# Patient Record
Sex: Female | Born: 1945 | Race: White | Hispanic: No | Marital: Married | Smoking: Former smoker
Health system: Southern US, Community
[De-identification: ages and names within clinical notes are randomized; demographics above are authoritative.]

## PROBLEM LIST (undated history)

## (undated) DIAGNOSIS — M1990 Unspecified osteoarthritis, unspecified site: Secondary | ICD-10-CM

## (undated) DIAGNOSIS — G4700 Insomnia, unspecified: Secondary | ICD-10-CM

## (undated) DIAGNOSIS — T7840XA Allergy, unspecified, initial encounter: Secondary | ICD-10-CM

## (undated) DIAGNOSIS — D72818 Other decreased white blood cell count: Secondary | ICD-10-CM

## (undated) DIAGNOSIS — G8929 Other chronic pain: Secondary | ICD-10-CM

## (undated) DIAGNOSIS — K219 Gastro-esophageal reflux disease without esophagitis: Secondary | ICD-10-CM

## (undated) DIAGNOSIS — M549 Dorsalgia, unspecified: Secondary | ICD-10-CM

## (undated) DIAGNOSIS — M62830 Muscle spasm of back: Secondary | ICD-10-CM

## (undated) DIAGNOSIS — F419 Anxiety disorder, unspecified: Secondary | ICD-10-CM

## (undated) DIAGNOSIS — K5790 Diverticulosis of intestine, part unspecified, without perforation or abscess without bleeding: Secondary | ICD-10-CM

## (undated) DIAGNOSIS — M810 Age-related osteoporosis without current pathological fracture: Secondary | ICD-10-CM

## (undated) DIAGNOSIS — K589 Irritable bowel syndrome without diarrhea: Secondary | ICD-10-CM

## (undated) DIAGNOSIS — I639 Cerebral infarction, unspecified: Secondary | ICD-10-CM

## (undated) DIAGNOSIS — D729 Disorder of white blood cells, unspecified: Secondary | ICD-10-CM

## (undated) HISTORY — DX: Allergy, unspecified, initial encounter: T78.40XA

## (undated) HISTORY — DX: Unspecified osteoarthritis, unspecified site: M19.90

## (undated) HISTORY — PX: OTHER SURGICAL HISTORY: SHX169

## (undated) HISTORY — PX: CHOLECYSTECTOMY: SHX55

## (undated) HISTORY — DX: Other decreased white blood cell count: D72.818

## (undated) HISTORY — PX: APPENDECTOMY: SHX54

## (undated) HISTORY — DX: Cerebral infarction, unspecified: I63.9

## (undated) HISTORY — DX: Disorder of white blood cells, unspecified: D72.9

---

## 2004-12-06 ENCOUNTER — Ambulatory Visit: Payer: Self-pay | Admitting: Internal Medicine

## 2005-01-10 ENCOUNTER — Ambulatory Visit: Payer: Self-pay | Admitting: Internal Medicine

## 2009-01-04 ENCOUNTER — Ambulatory Visit: Payer: Self-pay | Admitting: Cardiology

## 2009-01-05 ENCOUNTER — Inpatient Hospital Stay (HOSPITAL_COMMUNITY): Admission: EM | Admit: 2009-01-05 | Discharge: 2009-01-06 | Payer: Self-pay | Admitting: Emergency Medicine

## 2009-01-05 ENCOUNTER — Encounter (INDEPENDENT_AMBULATORY_CARE_PROVIDER_SITE_OTHER): Payer: Self-pay | Admitting: Internal Medicine

## 2009-02-04 ENCOUNTER — Emergency Department: Payer: Self-pay | Admitting: Emergency Medicine

## 2009-02-09 ENCOUNTER — Emergency Department: Payer: Self-pay | Admitting: Emergency Medicine

## 2009-12-21 ENCOUNTER — Ambulatory Visit: Payer: Self-pay | Admitting: Internal Medicine

## 2010-01-18 ENCOUNTER — Ambulatory Visit: Payer: Self-pay | Admitting: Gastroenterology

## 2010-02-15 ENCOUNTER — Ambulatory Visit: Payer: Self-pay | Admitting: Gastroenterology

## 2010-09-05 ENCOUNTER — Emergency Department (HOSPITAL_COMMUNITY)
Admission: EM | Admit: 2010-09-05 | Discharge: 2010-09-05 | Payer: Self-pay | Source: Home / Self Care | Admitting: Emergency Medicine

## 2010-12-14 LAB — URINALYSIS, ROUTINE W REFLEX MICROSCOPIC
Bilirubin Urine: NEGATIVE
Leukocytes, UA: NEGATIVE
Nitrite: NEGATIVE
Specific Gravity, Urine: 1.004 — ABNORMAL LOW (ref 1.005–1.030)
Urobilinogen, UA: 0.2 mg/dL (ref 0.0–1.0)

## 2010-12-14 LAB — DIFFERENTIAL
Eosinophils Absolute: 0 10*3/uL (ref 0.0–0.7)
Lymphocytes Relative: 17 % (ref 12–46)
Lymphs Abs: 0.9 10*3/uL (ref 0.7–4.0)
Neutro Abs: 4.2 10*3/uL (ref 1.7–7.7)

## 2010-12-14 LAB — CBC
MCH: 31.6 pg (ref 26.0–34.0)
MCHC: 33.1 g/dL (ref 30.0–36.0)
MCV: 95.4 fL (ref 78.0–100.0)

## 2010-12-14 LAB — COMPREHENSIVE METABOLIC PANEL
ALT: 13 U/L (ref 0–35)
Alkaline Phosphatase: 32 U/L — ABNORMAL LOW (ref 39–117)
CO2: 25 mEq/L (ref 19–32)
Chloride: 99 mEq/L (ref 96–112)
GFR calc Af Amer: >60 mL/min (ref >60)
GFR calc non Af Amer: >60 mL/min (ref >60)
Total Bilirubin: 0.7 mg/dL (ref 0.3–1.2)
Total Protein: 6.6 g/dL (ref 6.0–8.3)

## 2010-12-14 LAB — URINE MICROSCOPIC-ADD ON

## 2011-01-12 LAB — BASIC METABOLIC PANEL
CO2: 26 mEq/L (ref 19–32)
CO2: 29 mEq/L (ref 19–32)
Calcium: 9.1 mg/dL (ref 8.4–10.5)
Creatinine, Ser: 0.62 mg/dL (ref 0.4–1.2)
Creatinine, Ser: 0.65 mg/dL (ref 0.4–1.2)
GFR calc Af Amer: >60 mL/min (ref >60)
GFR calc Af Amer: >60 mL/min (ref >60)
GFR calc non Af Amer: >60 mL/min (ref >60)
Potassium: 3.3 mEq/L — ABNORMAL LOW (ref 3.5–5.1)
Sodium: 141 mEq/L (ref 135–145)

## 2011-01-12 LAB — COMPREHENSIVE METABOLIC PANEL
Albumin: 3.6 g/dL (ref 3.5–5.2)
BUN: 4 mg/dL — ABNORMAL LOW (ref 6–23)
Calcium: 8.8 mg/dL (ref 8.4–10.5)
Glucose, Bld: 123 mg/dL — ABNORMAL HIGH (ref 70–99)
Total Protein: 5.7 g/dL — ABNORMAL LOW (ref 6.0–8.3)

## 2011-01-12 LAB — APTT: aPTT: 26 seconds (ref 24–37)

## 2011-01-12 LAB — SEDIMENTATION RATE: Sed Rate: 6 mm/hr (ref 0–22)

## 2011-01-12 LAB — AMYLASE: Amylase: 72 U/L (ref 27–131)

## 2011-01-12 LAB — URINALYSIS, ROUTINE W REFLEX MICROSCOPIC
Glucose, UA: NEGATIVE mg/dL
Ketones, ur: 15 mg/dL — AB
Nitrite: NEGATIVE
Specific Gravity, Urine: >1.046 — ABNORMAL HIGH (ref 1.005–1.030)
pH: 5 (ref 5.0–8.0)

## 2011-01-12 LAB — URINE DRUGS OF ABUSE SCREEN W ALC, ROUTINE (REF LAB)
Amphetamine Screen, Ur: NEGATIVE
Barbiturate Quant, Ur: NEGATIVE
Marijuana Metabolite: NEGATIVE
Opiate Screen, Urine: NEGATIVE
Propoxyphene: NEGATIVE

## 2011-01-12 LAB — CBC
HCT: 33.7 % — ABNORMAL LOW (ref 36.0–46.0)
HCT: 35.6 % — ABNORMAL LOW (ref 36.0–46.0)
Hemoglobin: 11.9 g/dL — ABNORMAL LOW (ref 12.0–15.0)
MCHC: 33.3 g/dL (ref 30.0–36.0)
MCHC: 34.3 g/dL (ref 30.0–36.0)
MCV: 94.4 fL (ref 78.0–100.0)
Platelets: 173 10*3/uL (ref 150–400)
Platelets: 200 10*3/uL (ref 150–400)
Platelets: 211 10*3/uL (ref 150–400)
RBC: 3.58 MIL/uL — ABNORMAL LOW (ref 3.87–5.11)
RBC: 3.85 MIL/uL — ABNORMAL LOW (ref 3.87–5.11)
RBC: 4.01 MIL/uL (ref 3.87–5.11)
RDW: 13.3 % (ref 11.5–15.5)
RDW: 13.4 % (ref 11.5–15.5)
RDW: 13.7 % (ref 11.5–15.5)
WBC: 5.1 10*3/uL (ref 4.0–10.5)
WBC: 5.3 10*3/uL (ref 4.0–10.5)
WBC: 5.9 10*3/uL (ref 4.0–10.5)

## 2011-01-12 LAB — DIFFERENTIAL
Basophils Absolute: 0 10*3/uL (ref 0.0–0.1)
Eosinophils Absolute: 0 10*3/uL (ref 0.0–0.7)
Eosinophils Relative: 1 % (ref 0–5)
Lymphs Abs: 1.7 10*3/uL (ref 0.7–4.0)
Monocytes Absolute: 0.5 10*3/uL (ref 0.1–1.0)
Neutrophils Relative %: 62 % (ref 43–77)

## 2011-01-12 LAB — URINE MICROSCOPIC-ADD ON

## 2011-01-12 LAB — PROTIME-INR
INR: 1 (ref 0.00–1.49)
Prothrombin Time: 13.7 seconds (ref 11.6–15.2)

## 2011-01-12 LAB — SALICYLATE LEVEL: Salicylate Lvl: <4 mg/dL (ref 2.8–20.0)

## 2011-01-12 LAB — CARDIAC PANEL(CRET KIN+CKTOT+MB+TROPI)
CK, MB: 0.6 ng/mL (ref 0.3–4.0)
Total CK: 47 U/L (ref 7–177)
Troponin I: <0.01 ng/mL (ref 0.00–0.06)

## 2011-01-12 LAB — HEPARIN LEVEL (UNFRACTIONATED)
Heparin Unfractionated: 0.45 IU/mL (ref 0.30–0.70)
Heparin Unfractionated: 0.64 IU/mL (ref 0.30–0.70)

## 2011-01-12 LAB — LIPID PANEL
Cholesterol: 183 mg/dL (ref 0–200)
HDL: 64 mg/dL (ref >39)
Total CHOL/HDL Ratio: 2.9 RATIO

## 2011-01-12 LAB — LIPASE, BLOOD: Lipase: 22 U/L (ref 11–59)

## 2011-01-12 LAB — BRAIN NATRIURETIC PEPTIDE: Pro B Natriuretic peptide (BNP): 99 pg/mL (ref 0.0–100.0)

## 2011-01-12 LAB — TROPONIN I: Troponin I: <0.01 ng/mL (ref 0.00–0.06)

## 2011-01-12 LAB — HEMOGLOBIN A1C: Mean Plasma Glucose: 111 mg/dL

## 2011-01-12 LAB — CK TOTAL AND CKMB (NOT AT ARMC)
Relative Index: INVALID (ref 0.0–2.5)
Total CK: 48 U/L (ref 7–177)

## 2011-01-12 LAB — HOMOCYSTEINE: Homocysteine: 8.4 umol/L (ref 4.0–15.4)

## 2011-01-12 LAB — URINE CULTURE: Culture: NO GROWTH

## 2011-02-15 NOTE — H&P (Signed)
NAMENELLINE, LIO             ACCOUNT NO.:  [id number]   MEDICAL RECORD NO.:  [id number]          PATIENT TYPE:  EMS   LOCATION:  MAJO                         FACILITY:  [facility]   PHYSICIAN:  Carlena Hurl, MDDATE OF BIRTH:  04-Feb-1946   DATE OF ADMISSION:  01/04/2009  DATE OF DISCHARGE:                              HISTORY & PHYSICAL   CHIEF COMPLAINT:  Not feeling well and chest pain.   HISTORY OF PRESENT ILLNESS:  This is a 65 year old female who has no  past medical history coming in with a 2-day history of chest pain.  This  patient states that the chest pain started about 2 days ago and the  location of the chest pain is on the left side.  The history is given by  the patient and she says that the pain has been radiating to her left  arm and is also associated with the numbness of her left arm as well as  left hand.  She is also feeling little dizzy and nauseous and she was  having this chest pain.  She says there was nothing, which relieves  chest pain other than when she tries to rub the chest pain, it kind of  eases off the pain, but the pain does not go away totally.  When the  patient noticed the chest pain, she is not doing anything unusually, she  was just at home and working in her backyard.  She says that around end  of the February 2010, she had been to her daughter's place.  During that  time, the patient had an episode of feeling dizziness and passing out,  but because of heavy snowfall at that place, the patient did not go  anywhere else and did not seek any medical help.  She says that since  then she has been feeling little weak and feeling little dizzy.  There  is no associated sweating at this time and no associated vomiting.  No  abdominal pain.  No diarrhea.  No constipation.  When the patient came  into the ER, she was given aspirin.  She says that she fell little  drowsy, but the pain did not go away and later she was given  nitroglycerin and  after three doses of nitroglycerin, the patient said  her chest pain eased off a little bit.   PAST MEDICAL HISTORY:  Pretty much insignificant.  She was not diagnosed  with any medical problems in the past.   PAST SURGICAL HISTORY:  The patient had appendectomy and  cholecystectomy.   FAMILY HISTORY:  The patient's daughter was diagnosed with heart failure  during delivery and she said there was some genetic disorder diagnosed  at that time and she does not remember the name of it.  There is also  some kind of blood disorder runs in her family, which causes clots and  she does not know what exactly is that, but this patient never had clots  in the past.  Her uncle had heart attack when he was 80 year old.   SOCIAL HISTORY:  The patient is married and lives with her  husband.  She  had a history of smoking that long time ago about 20 years ago and was  not a heavy smoker and she drinks alcohol socially.  No IV drug abuse.   MEDICATIONS:  She takes medications to help her sleep and she does not  remember the dose of it and name of it at this time, and she is not on  any other medications at home.   ALLERGIES:  No known drug allergies.   PHYSICAL EXAMINATION:  GENERAL:  This is a 65 year old Caucasian lady  who is lying comfortably on the bed without any severe chest pain or  shortness of breath.  HEENT:  Head is atraumatic, normocephalic.  PERRLA.  Tympanic membranes  intact.  No discharge from eyes or ears.  NECK:  Supple.  No JVD appreciated.  No lumps palpated.  No thyroid, no  goiter palpated.  LUNGS:  Clear to auscultation bilaterally.  CVS:  S1 and S2 heard with regular rate and rhythm.  ABDOMEN:  Soft.  Bowel sounds present, nontender, and nondistended.  EXTREMITIES:  No pedal edema noted.  Pulses palpable bilaterally and no  cyanosis and no clubbing appreciated.   LABORATORY DATA:  Labs for this patient is revealed WBC 5.9, hemoglobin  12.5, hematocrit 36.3, platelets of  211.  Sodium 141, potassium 3.3,  chloride 106, bicarb 29, glucose 104, BUN 5, creatinine 0.65.  Cardiac  markers first set of CK-MB 0.8.  First set of troponin I less than 0.01,  total CK 60.  She had a chest x-ray done, which showed no active  changes.   Vitals for this patient revealed blood pressure when she initially came  in 134/73 with pulse rate of 65, respirations 18, temperature 98.4,  saturating 99% on room air.   ASSESSMENT AND PLAN:  1. Chest pain to rule out myocardial infarction.  At this time, it      mostly looks like atypical as the patient states that her chest      pain we can reproduce it on palpation and with kind of rubbing, the      pain eases off.  Because of the age, 33 years, and her family      history in her uncle had a heart attack at the age of 63.  We will      check the cardiac enzymes and follow up with EKG just to make sure      that it is not cardiac in origin.  So far, her first set of cardiac      enzymes are negative and EKG shows no significant changes.  This      patient had a stress test done about 12 years ago, which was      negative according to her and at this time, once the patient's      cardiac enzymes are negative and the patient's chest pain improves,      she will need to get a stress test done as an outpatient versus      inpatient.  Also get a 2D echo done on this patient as she never      had done in the past.  2. Hypokalemia.  At this time, the reason for hypokalemia is unknown.      So, we will replace with potassium chloride p.o. equivalence.  3. Deep vein thrombosis prophylaxis.  The patient will be given      Lovenox 40 mg subcu one time a day.  Carlena Hurl, MD  Electronically Signed     JD/MEDQ  D:  01/04/2009  T:  01/05/2009  Job:  [id number]

## 2011-02-15 NOTE — Consult Note (Signed)
Ashley Murillo, MEMON             ACCOUNT NO.:  [id number]   MEDICAL RECORD NO.:  [id number]          PATIENT TYPE:  INP   LOCATION:  3103                         FACILITY:  [facility]   PHYSICIAN:  Rollene Rotunda, MD, FACCDATE OF BIRTH:  [birth year]   DATE OF CONSULTATION:  01/05/2009  DATE OF DISCHARGE:                                 CONSULTATION   REQUESTING PHYSICIAN:  [facility]   PRIMARY CARE PHYSICIAN:  Dr. Randa Lynn in [place].   PRIMARY CARDIOLOGIST:  Rayvon Char, MD, [facility]   REASON FOR CONSULTATION:  Chest pain.   HISTORY OF PRESENT ILLNESS:  A 65 year old Caucasian female without  prior cardiac history was admitted with severe left-sided chest pain  radiating to the back with numbness and tingling of the left arm and  epigastric pain.  This has been going on and off for 2 days, but has  been constant since Sunday a.m., which she described as sharp with some  epigastric pain and tenderness.  The pain is reproducible to palpation  in the chest and abdomen.  She has had some low-grade nausea and  burping.  The patient also admits to dizziness, syncope, and chest pain  in February of this year, but did not seek attention.  She states that  it made her pass out while she was walking to the snow and then she had  recurrence of heartburn pain that afternoon and had no further  complaints of pain since.  However, she states that she has not been  herself since that time.  The patient has no prior history of  hypertension, diabetes, or hypercholesterolemia.  On admission, she was  hypokalemic with potassium of 3.4 which was repleted.   REVIEW OF SYSTEMS:  Positive for radiation to the left arm with numbness  and pain in the left arm, low-grade nausea, and epigastric pain.  She  also noticed that below her right eye she has had a red bruise started,  but denies any falling.   SOCIAL HISTORY:  The patient lives in [place] with her husband.  She is  a  Interior and spatial designer.  She smoked many years ago.  She is a social drinker.  She does not exercise regularly.   FAMILY HISTORY:  Mother with CHF.  Father unknown.  She has 1 daughter  who had postpartum CHF.  Of note, the patient states that her daughter  stated that she has got a clotting disorder which apparently is  hereditary, although the patient has not been tested for this herself.   CURRENT MEDICATIONS AT HOME:  Ambien p.r.n.   ALLERGIES:  No known drug allergies.   CURRENT LABORATORY DATA:  Sodium 144, potassium 4.1, glucose 109, CO2 of  109, BUN 5, and creatinine 0.62.  Hemoglobin 13.0, hematocrit 38.2,  white blood cells 5.3, and platelets 194.  BNP 99.  Magnesium 2.0 and  phosphorus 3.9.  PTT 26, PT 13.7, INR 1.0, and troponin less than 0.01  and 0.01 respectively.  EKG revealing sinus rhythm, ventricular rate of  78 beats per minute with anterior T-wave inversion.  Chest x-ray  revealing no active disease.  CT scan of the abdomen, chest and, pelvis  was negative for aortic dissection.   PHYSICAL EXAMINATION:  VITAL SIGNS:  Blood pressure in the left arm  146/71, blood pressure in the right arm 126/51, pulse 64, respirations  18, temperature 98.2, and O2 sat 100% on 2 liters.  GENERAL:  She is frail and appears sleepy.  HEENT:  Head is normocephalic and atraumatic.  Eyes, PERRL.  The mucous  membranes of the mouth are dry.  It is noted that she has a red  laceration under her right eye.  NECK:  Supple without JVD or carotid bruits.  CARDIOVASCULAR:  Regular rate and rhythm without murmurs, rubs, gallops,  or bruits.  LUNGS:  Clear to auscultation without wheezes, rales, or rhonchi.  CHEST:  The left side of her chest is tender to touch.  ABDOMEN:  Soft.  Tenderness in the epigastric area with no bruits.  2+  bowel sounds.  EXTREMITIES:  Without clubbing, cyanosis, or edema.  There is no calf  tenderness on palpation.  NEURO:  Sleepy, but responsive.   IMPRESSION:  Chest pain  with typical and atypical symptoms.  EKG with T-  wave inversion anteriorly, but negative cardiac enzymes.  Pain is  reproducible on palpation.  CT of the chest is negative for AAA or  aortic dissection.  Nitroglycerin sublingual helped some, however,  morphine did cause the pain to go away.   The patient was seen and examined by myself and Dr. Rollene Rotunda.  Chest pain is moderately high to high pretest probability of chest pain  being cardiac.  In this situation, I suggest that cardiac  catheterization.  We have discussed this with the patient and her  husband with the risks and benefits explained.  The patient agrees to  proceed.      Bettey Mare. Lyman Bishop, NP      Rollene Rotunda, MD, [facility]  Electronically Signed    KML/MEDQ  D:  01/05/2009  T:  01/06/2009  Job:  [id number]   cc:   Reola Mosher. Randa Lynn, MD

## 2011-02-18 NOTE — Discharge Summary (Signed)
Ashley Murillo, Ashley Murillo             ACCOUNT NO.:  [id number]   MEDICAL RECORD NO.:  [id number]          PATIENT TYPE:  INP   LOCATION:  5501                         FACILITY:  [facility]   PHYSICIAN:  Hind I Elsaid, MD      DATE OF BIRTH:  Feb 18, 1946   DATE OF ADMISSION:  01/04/2009  DATE OF DISCHARGE:  01/06/2009                               DISCHARGE SUMMARY   DISCHARGE DIAGNOSIS:  Chest pain, myocardial infarction, rule out with  negative cardiac catheterization.   MEDICATIONS:  The patient did not receive any medication as the patient  signed AMA on April 6 at 10 o'clock midnight.   PROCEDURE:  Cardiac cath which was apparently normal.  Coronary  angiograph and normal left ventricular function.   HISTORY OF PRESENT ILLNESS:  This is a 65 year old female admitted with  two days of chest pain with hand numbness.  The patient admitted to step  down secondary to severity of her chest pain.  Started on Morphine and  nitroglycerin drip.  As the patient continued to complain of hand and  face numbness possibility of aortic dissection was prioritized and the  patient had CT angio of the abdomen and pelvis which was negative.  Cardiology consulted who recommended an immediate cardiac cath.  The  patient was taken to the cardiac cath which was negative.  The patient's  symptoms improved.  Did not notice any abnormal labs.  Drug screening  was negative.  The patient decided to leave against medical advice after  the cardiac cath and MD at night notified and the patient was discharged  against medical advice.      Hind Bosie Helper, MD  Electronically Signed     HIE/MEDQ  D:  02/26/2009  T:  02/27/2009  Job:  [id number]

## 2012-05-07 ENCOUNTER — Emergency Department: Payer: Self-pay | Admitting: Emergency Medicine

## 2012-06-18 ENCOUNTER — Encounter: Payer: Self-pay | Admitting: Internal Medicine

## 2012-07-03 ENCOUNTER — Encounter: Payer: Self-pay | Admitting: Internal Medicine

## 2013-10-27 ENCOUNTER — Emergency Department: Payer: Self-pay | Admitting: Internal Medicine

## 2013-10-27 LAB — URINALYSIS, COMPLETE
Bacteria: NONE SEEN
Bilirubin,UR: NEGATIVE
Blood: NEGATIVE
GLUCOSE, UR: NEGATIVE mg/dL (ref 0–75)
KETONE: NEGATIVE
Leukocyte Esterase: NEGATIVE
NITRITE: NEGATIVE
PH: 6 (ref 4.5–8.0)
Protein: NEGATIVE
SPECIFIC GRAVITY: 1.008 (ref 1.003–1.030)

## 2013-12-26 ENCOUNTER — Encounter: Payer: Self-pay | Admitting: Orthopedic Surgery

## 2014-01-01 ENCOUNTER — Encounter: Payer: Self-pay | Admitting: Orthopedic Surgery

## 2014-01-31 ENCOUNTER — Encounter: Payer: Self-pay | Admitting: Orthopedic Surgery

## 2014-04-21 DIAGNOSIS — F411 Generalized anxiety disorder: Secondary | ICD-10-CM | POA: Insufficient documentation

## 2014-04-21 DIAGNOSIS — G8929 Other chronic pain: Secondary | ICD-10-CM | POA: Insufficient documentation

## 2014-04-21 DIAGNOSIS — G4700 Insomnia, unspecified: Secondary | ICD-10-CM | POA: Insufficient documentation

## 2014-04-21 DIAGNOSIS — K219 Gastro-esophageal reflux disease without esophagitis: Secondary | ICD-10-CM | POA: Insufficient documentation

## 2014-04-21 DIAGNOSIS — M810 Age-related osteoporosis without current pathological fracture: Secondary | ICD-10-CM | POA: Insufficient documentation

## 2014-11-03 DIAGNOSIS — M810 Age-related osteoporosis without current pathological fracture: Secondary | ICD-10-CM | POA: Insufficient documentation

## 2014-12-11 ENCOUNTER — Other Ambulatory Visit: Payer: Self-pay | Admitting: Gastroenterology

## 2014-12-11 DIAGNOSIS — R1013 Epigastric pain: Secondary | ICD-10-CM

## 2014-12-15 ENCOUNTER — Ambulatory Visit
Admission: RE | Admit: 2014-12-15 | Discharge: 2014-12-15 | Disposition: A | Discharge status: Home / Self Care | Payer: Medicare Other | Source: Ambulatory Visit | Attending: Gastroenterology | Admitting: Gastroenterology

## 2014-12-15 DIAGNOSIS — R1013 Epigastric pain: Secondary | ICD-10-CM

## 2014-12-15 MED ORDER — IOPAMIDOL (ISOVUE-300) INJECTION 61%
100.0000 mL | Freq: Once | INTRAVENOUS | Status: AC | PRN
  Administered 2014-12-15: 100 mL via INTRAVENOUS

## 2014-12-16 ENCOUNTER — Other Ambulatory Visit: Payer: Self-pay

## 2014-12-20 ENCOUNTER — Encounter (HOSPITAL_COMMUNITY): Payer: Self-pay | Admitting: Emergency Medicine

## 2014-12-20 ENCOUNTER — Emergency Department (HOSPITAL_COMMUNITY)
Admission: EM | Admit: 2014-12-20 | Discharge: 2014-12-20 | Disposition: A | Discharge status: Home / Self Care | Payer: Medicare Other | Attending: Emergency Medicine | Admitting: Emergency Medicine

## 2014-12-20 DIAGNOSIS — R109 Unspecified abdominal pain: Secondary | ICD-10-CM | POA: Diagnosis present

## 2014-12-20 DIAGNOSIS — R110 Nausea: Secondary | ICD-10-CM | POA: Insufficient documentation

## 2014-12-20 DIAGNOSIS — K5900 Constipation, unspecified: Secondary | ICD-10-CM | POA: Diagnosis not present

## 2014-12-20 DIAGNOSIS — R1084 Generalized abdominal pain: Secondary | ICD-10-CM

## 2014-12-20 LAB — URINALYSIS, ROUTINE W REFLEX MICROSCOPIC
Bilirubin Urine: NEGATIVE
Glucose, UA: NEGATIVE mg/dL
Ketones, ur: NEGATIVE mg/dL
Leukocytes, UA: NEGATIVE
Nitrite: NEGATIVE
PH: 5 (ref 5.0–8.0)
PROTEIN: NEGATIVE mg/dL
Specific Gravity, Urine: 1.017 (ref 1.005–1.030)
UROBILINOGEN UA: 0.2 mg/dL (ref 0.0–1.0)

## 2014-12-20 LAB — I-STAT CHEM 8, ED
BUN: 7 mg/dL (ref 6–23)
Calcium, Ion: 1.16 mmol/L (ref 1.13–1.30)
Chloride: 103 mmol/L (ref 96–112)
Creatinine, Ser: 0.8 mg/dL (ref 0.50–1.10)
Glucose, Bld: 119 mg/dL — ABNORMAL HIGH (ref 70–99)
HEMATOCRIT: 45 % (ref 36.0–46.0)
HEMOGLOBIN: 15.3 g/dL — AB (ref 12.0–15.0)
Potassium: 3.9 mmol/L (ref 3.5–5.1)
Sodium: 139 mmol/L (ref 135–145)
TCO2: 22 mmol/L (ref 0–100)

## 2014-12-20 LAB — CBC WITH DIFFERENTIAL/PLATELET
BASOS ABS: 0.1 10*3/uL (ref 0.0–0.1)
Basophils Relative: 1 % (ref 0–1)
Eosinophils Absolute: 0 10*3/uL (ref 0.0–0.7)
Eosinophils Relative: 0 % (ref 0–5)
HCT: 41.4 % (ref 36.0–46.0)
Hemoglobin: 13.8 g/dL (ref 12.0–15.0)
Lymphocytes Relative: 28 % (ref 12–46)
Lymphs Abs: 1.5 10*3/uL (ref 0.7–4.0)
MCH: 31.2 pg (ref 26.0–34.0)
MCHC: 33.3 g/dL (ref 30.0–36.0)
MCV: 93.7 fL (ref 78.0–100.0)
Monocytes Absolute: 0.3 10*3/uL (ref 0.1–1.0)
Monocytes Relative: 7 % (ref 3–12)
Neutro Abs: 3.4 10*3/uL (ref 1.7–7.7)
Neutrophils Relative %: 64 % (ref 43–77)
Platelets: 220 10*3/uL (ref 150–400)
RBC: 4.42 MIL/uL (ref 3.87–5.11)
RDW: 12.9 % (ref 11.5–15.5)
WBC: 5.3 10*3/uL (ref 4.0–10.5)

## 2014-12-20 LAB — BASIC METABOLIC PANEL
Anion gap: 5 (ref 5–15)
BUN: 7 mg/dL (ref 6–23)
CHLORIDE: 104 mmol/L (ref 96–112)
CO2: 28 mmol/L (ref 19–32)
Calcium: 9.5 mg/dL (ref 8.4–10.5)
Creatinine, Ser: 0.76 mg/dL (ref 0.50–1.10)
GFR calc non Af Amer: 85 mL/min — ABNORMAL LOW (ref >90)
Glucose, Bld: 120 mg/dL — ABNORMAL HIGH (ref 70–99)
Potassium: 3.9 mmol/L (ref 3.5–5.1)
Sodium: 137 mmol/L (ref 135–145)

## 2014-12-20 LAB — AMYLASE: AMYLASE: 90 U/L (ref 0–105)

## 2014-12-20 LAB — URINE MICROSCOPIC-ADD ON

## 2014-12-20 MED ORDER — HYDROCODONE-ACETAMINOPHEN 5-325 MG PO TABS: 1.0000 | ORAL_TABLET | Freq: Four times a day (QID) | ORAL | Status: DC | PRN

## 2014-12-20 MED ORDER — SUCRALFATE 1 GM/10ML PO SUSP: 1.0000 g | Freq: Three times a day (TID) | ORAL | Status: DC

## 2014-12-20 MED ORDER — FENTANYL CITRATE 0.05 MG/ML IJ SOLN
25.0000 ug | Freq: Once | INTRAMUSCULAR | Status: AC
  Administered 2014-12-20: 25 ug via INTRAVENOUS
  Filled 2014-12-20: qty 2

## 2014-12-20 MED ORDER — PANTOPRAZOLE SODIUM 40 MG PO TBEC: 40.0000 mg | DELAYED_RELEASE_TABLET | Freq: Every day | ORAL | Status: AC

## 2014-12-20 MED ORDER — SODIUM CHLORIDE 0.9 % IV BOLUS (SEPSIS)
1000.0000 mL | Freq: Once | INTRAVENOUS | Status: AC
  Administered 2014-12-20: 1000 mL via INTRAVENOUS

## 2014-12-20 NOTE — ED Provider Notes (Signed)
CSN: [id number]     Arrival date & time 12/20/14  1334 History   First MD Initiated Contact with Patient 12/20/14 1449     Chief Complaint  Patient presents with  . Abdominal Pain    HPI  Patient presents with concern of ongoing abdominal pain. Pain has been present for about 2 years, is diffuse, migratory. 2 weeks ago, after discussion with gastrin neurology, patient stopped all medication in an effort to further delineate the source of her pain. Subsequent, the patient's pain has increased, then more severe and more present. Pain remains migratory, front to back, with lateral radiation. There is associated nausea, anorexia, but no vomiting, no diarrhea. No new fevers, chills, chest pain, dyspnea. Minimal relief with tramadol today. 40s, the patient had CT scan for evaluation, reportedly normal. Over the past 48 hours the pain is increased, and the patient spoke with her gastrin neurologist today, was referred here for evaluation.   History reviewed. No pertinent past medical history. History reviewed. No pertinent past surgical history. No family history on file. History  Substance Use Topics  . Smoking status: Never Smoker   . Smokeless tobacco: Not on file  . Alcohol Use: No   OB History    No data available     Review of Systems  Constitutional:       Per HPI, otherwise negative  HENT:       Per HPI, otherwise negative  Respiratory:       Per HPI, otherwise negative  Cardiovascular:       Per HPI, otherwise negative  Gastrointestinal: Positive for nausea, abdominal pain and constipation. Negative for vomiting.  Endocrine:       Negative aside from HPI  Genitourinary:       Neg aside from HPI   Musculoskeletal:       Per HPI, otherwise negative  Skin: Negative.   Neurological: Negative for syncope.      Allergies  Review of patient's allergies indicates not on file.  Home Medications   Prior to Admission medications   Not on File   BP 141/64 mmHg   Pulse 80  Temp(Src) 97.7 F (36.5 C) (Oral)  Resp 18  SpO2 100% Physical Exam  Constitutional: She is oriented to person, place, and time. She appears well-developed and well-nourished. No distress.  HENT:  Head: Normocephalic and atraumatic.  Eyes: Conjunctivae and EOM are normal.  Cardiovascular: Normal rate and regular rhythm.   Pulmonary/Chest: Effort normal and breath sounds normal. No stridor. No respiratory distress.  Abdominal: She exhibits no distension.  Diffuse mild guarding, no peritoneal findings.  Musculoskeletal: She exhibits no edema.  Neurological: She is alert and oriented to person, place, and time. No cranial nerve deficit.  Skin: Skin is warm and dry.  Psychiatric: She has a normal mood and affect.  Nursing note and vitals reviewed.   ED Course  Procedures (including critical care time) Labs Review Labs Reviewed  BASIC METABOLIC PANEL - Abnormal; Notable for the following:    Glucose, Bld 120 (*)    GFR calc non Af Amer 85 (*)    All other components within normal limits  I-STAT CHEM 8, ED - Abnormal; Notable for the following:    Glucose, Bld 119 (*)    Hemoglobin 15.3 (*)    All other components within normal limits  AMYLASE  CBC WITH DIFFERENTIAL/PLATELET  URINALYSIS, ROUTINE W REFLEX MICROSCOPIC  URINALYSIS, ROUTINE W REFLEX MICROSCOPIC     After the patient's initial  evaluation I discussed her case with gastroenterology. I reviewed the patient's chart, including CT scan from earlier this week. No notable findings.  4:41 PM I discussed the patient's case with the gastroenterologist, with whom she spoke earlier today. We discussed the patient's recent evaluation, including CT scan, today's labs, her history of chronic abdominal pain. Patient will follow up with him in the office. MDM  History presents with ongoing abdominal pain. Patient has a history of pain for several years, and over the past weeks has had worsening pain.  Here she is a  soft, non-peritoneal abdomen.  Labs are unremarkable, CT scan from earlier this week is unremarkable. Patient has a prior appendectomy, cholecystectomy.  No urinary complaints, evidence for UTI.  No evidence for fever or occult infection.  Patient discharged to follow-up with gastroenterology.   Gerhard Munchobert Markesia Crilly, MD 12/20/14 1655

## 2014-12-20 NOTE — Discharge Instructions (Signed)
As discussed, it is important that you follow up as soon as possible with your physician for continued management of your condition. ° °If you develop any new, or concerning changes in your condition, please return to the emergency department immediately. ° ° °Abdominal Pain °Many things can cause abdominal pain. Usually, abdominal pain is not caused by a disease and will improve without treatment. It can often be observed and treated at home. Your health care provider will do a physical exam and possibly order blood tests and X-rays to help determine the seriousness of your pain. However, in many cases, more time must pass before a clear cause of the pain can be found. Before that point, your health care provider may not know if you need more testing or further treatment. °HOME CARE INSTRUCTIONS  °Monitor your abdominal pain for any changes. The following actions may help to alleviate any discomfort you are experiencing: °· Only take over-the-counter or prescription medicines as directed by your health care provider. °· Do not take laxatives unless directed to do so by your health care provider. °· Try a clear liquid diet (broth, tea, or water) as directed by your health care provider. Slowly move to a bland diet as tolerated. °SEEK MEDICAL CARE IF: °· You have unexplained abdominal pain. °· You have abdominal pain associated with nausea or diarrhea. °· You have pain when you urinate or have a bowel movement. °· You experience abdominal pain that wakes you in the night. °· You have abdominal pain that is worsened or improved by eating food. °· You have abdominal pain that is worsened with eating fatty foods. °· You have a fever. °SEEK IMMEDIATE MEDICAL CARE IF:  °· Your pain does not go away within 2 hours. °· You keep throwing up (vomiting). °· Your pain is felt only in portions of the abdomen, such as the right side or the left lower portion of the abdomen. °· You pass bloody or black tarry stools. °MAKE SURE  YOU: °· Understand these instructions.   °· Will watch your condition.   °· Will get help right away if you are not doing well or get worse.   °Document Released: 06/29/2005 Document Revised: 09/24/2013 Document Reviewed: 05/29/2013 °[employer]® Patient Information ©2015 [employer]. This information is not intended to replace advice given to you by your health care provider. Make sure you discuss any questions you have with your health care provider. ° °

## 2014-12-20 NOTE — ED Notes (Signed)
Pt/husband stated, I've been having abdominal pain that involves my intestines for 2 years.

## 2014-12-22 ENCOUNTER — Encounter (HOSPITAL_COMMUNITY): Payer: Self-pay | Admitting: Family Medicine

## 2014-12-22 ENCOUNTER — Emergency Department (HOSPITAL_COMMUNITY)
Admission: EM | Admit: 2014-12-22 | Discharge: 2014-12-22 | Disposition: A | Discharge status: Home / Self Care | Payer: Medicare Other | Attending: Emergency Medicine | Admitting: Emergency Medicine

## 2014-12-22 DIAGNOSIS — Z91040 Latex allergy status: Secondary | ICD-10-CM | POA: Insufficient documentation

## 2014-12-22 DIAGNOSIS — R1084 Generalized abdominal pain: Secondary | ICD-10-CM | POA: Diagnosis not present

## 2014-12-22 DIAGNOSIS — R112 Nausea with vomiting, unspecified: Secondary | ICD-10-CM | POA: Insufficient documentation

## 2014-12-22 DIAGNOSIS — Z79899 Other long term (current) drug therapy: Secondary | ICD-10-CM | POA: Insufficient documentation

## 2014-12-22 DIAGNOSIS — M549 Dorsalgia, unspecified: Secondary | ICD-10-CM | POA: Diagnosis present

## 2014-12-22 DIAGNOSIS — M546 Pain in thoracic spine: Secondary | ICD-10-CM | POA: Diagnosis not present

## 2014-12-22 LAB — CBC WITH DIFFERENTIAL/PLATELET
Basophils Absolute: 0.1 10*3/uL (ref 0.0–0.1)
Basophils Relative: 1 % (ref 0–1)
EOS ABS: 0 10*3/uL (ref 0.0–0.7)
Eosinophils Relative: 0 % (ref 0–5)
HEMATOCRIT: 38.5 % (ref 36.0–46.0)
HEMOGLOBIN: 12.9 g/dL (ref 12.0–15.0)
LYMPHS ABS: 1.4 10*3/uL (ref 0.7–4.0)
Lymphocytes Relative: 23 % (ref 12–46)
MCH: 31.3 pg (ref 26.0–34.0)
MCHC: 33.5 g/dL (ref 30.0–36.0)
MCV: 93.4 fL (ref 78.0–100.0)
MONOS PCT: 4 % (ref 3–12)
Monocytes Absolute: 0.3 10*3/uL (ref 0.1–1.0)
NEUTROS PCT: 72 % (ref 43–77)
Neutro Abs: 4.3 10*3/uL (ref 1.7–7.7)
Platelets: 208 10*3/uL (ref 150–400)
RBC: 4.12 MIL/uL (ref 3.87–5.11)
RDW: 12.9 % (ref 11.5–15.5)
WBC: 6 10*3/uL (ref 4.0–10.5)

## 2014-12-22 LAB — COMPREHENSIVE METABOLIC PANEL
ALT: 14 U/L (ref 0–35)
AST: 18 U/L (ref 0–37)
Albumin: 4.2 g/dL (ref 3.5–5.2)
Alkaline Phosphatase: 29 U/L — ABNORMAL LOW (ref 39–117)
Anion gap: 6 (ref 5–15)
BUN: <5 mg/dL — ABNORMAL LOW (ref 6–23)
CALCIUM: 9.3 mg/dL (ref 8.4–10.5)
CO2: 28 mmol/L (ref 19–32)
CREATININE: 0.74 mg/dL (ref 0.50–1.10)
Chloride: 104 mmol/L (ref 96–112)
GFR calc non Af Amer: 85 mL/min — ABNORMAL LOW (ref >90)
Glucose, Bld: 109 mg/dL — ABNORMAL HIGH (ref 70–99)
Potassium: 3.6 mmol/L (ref 3.5–5.1)
SODIUM: 138 mmol/L (ref 135–145)
TOTAL PROTEIN: 6.7 g/dL (ref 6.0–8.3)
Total Bilirubin: 0.9 mg/dL (ref 0.3–1.2)

## 2014-12-22 LAB — URINALYSIS, ROUTINE W REFLEX MICROSCOPIC
Bilirubin Urine: NEGATIVE
Glucose, UA: NEGATIVE mg/dL
KETONES UR: 15 mg/dL — AB
Nitrite: NEGATIVE
PROTEIN: NEGATIVE mg/dL
Specific Gravity, Urine: 1.007 (ref 1.005–1.030)
Urobilinogen, UA: 0.2 mg/dL (ref 0.0–1.0)
pH: 5.5 (ref 5.0–8.0)

## 2014-12-22 LAB — LIPASE, BLOOD: Lipase: 21 U/L (ref 11–59)

## 2014-12-22 LAB — URINE MICROSCOPIC-ADD ON

## 2014-12-22 MED ORDER — HYDROCODONE-ACETAMINOPHEN 5-325 MG PO TABS: ORAL_TABLET | ORAL | Status: DC

## 2014-12-22 MED ORDER — ONDANSETRON HCL 4 MG/2ML IJ SOLN
4.0000 mg | Freq: Once | INTRAMUSCULAR | Status: AC
  Administered 2014-12-22: 4 mg via INTRAVENOUS
  Filled 2014-12-22: qty 2

## 2014-12-22 MED ORDER — FENTANYL CITRATE 0.05 MG/ML IJ SOLN
100.0000 ug | Freq: Once | INTRAMUSCULAR | Status: AC
  Administered 2014-12-22: 100 ug via INTRAVENOUS
  Filled 2014-12-22: qty 2

## 2014-12-22 MED ORDER — SODIUM CHLORIDE 0.9 % IV BOLUS (SEPSIS)
1000.0000 mL | Freq: Once | INTRAVENOUS | Status: AC
  Administered 2014-12-22: 1000 mL via INTRAVENOUS

## 2014-12-22 NOTE — ED Provider Notes (Signed)
CSN: [id number]     Arrival date & time 12/22/14  1309 History   First MD Initiated Contact with Patient 12/22/14 1635     Chief Complaint  Patient presents with  . Abdominal Pain  . Back Pain     (Consider location/radiation/quality/duration/timing/severity/associated sxs/prior Treatment) HPI Comments: Patient with history of chronic abdominal pain presents with complaint of upper abdominal pain similar to previous for greater than one week. It is associated with nausea and vomiting that has become persistent over the weekend (past 2 days). She states that she is unable to eat or drink. Pain radiates to her right middle back. It is described as sharp. Patient was seen in emergency department 2 days ago for the same. She had a normal workup. She was discharged home on Protonix, Vicodin, Carafate. Of note, patient had a negative CT abdomen/pelvis one week ago. Patient saw her primary care physician this morning. She was supposed to follow-up with GI afterwards, however she was unable to see them, and they told her to go to the emergency department. No urinary symptoms. No cough or shortness of breath. No heavy NSAID or alcohol use. She has not taken her medications today. No previous EGD. The onset of this condition was acute. The course is constant. Aggravating factors: palpation. Alleviating factors: none.    Patient is a 69 y.o. female presenting with abdominal pain and back pain. The history is provided by the patient and medical records.  Abdominal Pain Associated symptoms: nausea and vomiting   Associated symptoms: no chest pain, no constipation, no cough, no diarrhea, no dysuria, no fever and no sore throat   Back Pain Associated symptoms: abdominal pain   Associated symptoms: no chest pain, no dysuria, no fever and no headaches     History reviewed. No pertinent past medical history. History reviewed. No pertinent past surgical history. History reviewed. No pertinent family  history. History  Substance Use Topics  . Smoking status: Never Smoker   . Smokeless tobacco: Not on file  . Alcohol Use: No   OB History    No data available     Review of Systems  Constitutional: Negative for fever.  HENT: Negative for rhinorrhea and sore throat.   Eyes: Negative for redness.  Respiratory: Negative for cough.   Cardiovascular: Negative for chest pain.  Gastrointestinal: Positive for nausea, vomiting and abdominal pain. Negative for diarrhea, constipation and blood in stool.  Genitourinary: Negative for dysuria.  Musculoskeletal: Positive for back pain. Negative for myalgias.  Skin: Negative for rash.  Neurological: Negative for headaches.      Allergies  Sulfa antibiotics and Latex  Home Medications   Prior to Admission medications   Medication Sig Start Date End Date Taking? Authorizing Provider  b complex vitamins tablet Take 1 tablet by mouth daily.    Historical Provider, MD  bisacodyl (DULCOLAX) 5 MG EC tablet Take 10 mg by mouth daily as needed for moderate constipation (constipation).    Historical Provider, MD  CALCIUM PO Take 1 tablet by mouth daily.    Historical Provider, MD  Cholecalciferol (VITAMIN D PO) Take 1 tablet by mouth daily.    Historical Provider, MD  diphenhydrAMINE (BENADRYL) 25 mg capsule Take 25 mg by mouth at bedtime as needed for allergies.     Historical Provider, MD  DULoxetine (CYMBALTA) 60 MG capsule Take 60 mg by mouth daily. 11/17/14   Historical Provider, MD  HYDROcodone-acetaminophen (NORCO/VICODIN) 5-325 MG per tablet Take 1 tablet by mouth every 6 (  six) hours as needed (pain). 12/20/14   Gerhard Munch, MD  latanoprost (XALATAN) 0.005 % ophthalmic solution Place 1 drop into the left eye at bedtime.  12/01/14   Historical Provider, MD  Multiple Vitamin (MULTIVITAMIN WITH MINERALS) TABS tablet Take 1 tablet by mouth daily.    Historical Provider, MD  ondansetron (ZOFRAN) 8 MG tablet Take 8 mg by mouth every 8 (eight)  hours as needed for nausea or vomiting.     Historical Provider, MD  pantoprazole (PROTONIX) 40 MG tablet Take 1 tablet (40 mg total) by mouth daily. 12/20/14   Gerhard Munch, MD  sucralfate (CARAFATE) 1 GM/10ML suspension Take 10 mLs (1 g total) by mouth 4 (four) times daily -  with meals and at bedtime. 12/20/14   Gerhard Munch, MD  traZODone (DESYREL) 100 MG tablet Take 100 mg by mouth at bedtime as needed for sleep.  12/01/14   Historical Provider, MD  triamcinolone cream (KENALOG) 0.1 % Apply 1 application topically 2 (two) times daily as needed (itching).     Historical Provider, MD  TURMERIC PO Take 300 mg by mouth 2 (two) times daily.    Historical Provider, MD  zoledronic acid (RECLAST) 5 MG/100ML SOLN injection Inject 5 mg into the vein once. Yearly - last injection January 2015    Historical Provider, MD   BP 151/68 mmHg  Pulse 80  Temp(Src) 98.4 F (36.9 C) (Oral)  Resp 18  SpO2 99%   Physical Exam  Constitutional: She appears well-developed and well-nourished.  HENT:  Head: Normocephalic and atraumatic.  Mouth/Throat: Oropharynx is clear and moist.  Eyes: Conjunctivae are normal. Pupils are equal, round, and reactive to light. Right eye exhibits no discharge. Left eye exhibits no discharge.  Neck: Normal range of motion. Neck supple.  Cardiovascular: Normal rate, regular rhythm and normal heart sounds.   No murmur heard. Pulmonary/Chest: Effort normal. No respiratory distress. She has no wheezes. She has no rales.  Abdominal: Soft. There is tenderness (mild, generalized --> radiates to R mid back which is tender to palpation as well). There is no rebound and no guarding.  Musculoskeletal: She exhibits no edema.  Neurological: She is alert.  Skin: Skin is warm and dry.  Psychiatric: She has a normal mood and affect.  Nursing note and vitals reviewed.   ED Course  Procedures (including critical care time) Labs Review Labs Reviewed  COMPREHENSIVE METABOLIC PANEL -  Abnormal; Notable for the following:    Glucose, Bld 109 (*)    BUN <5 (*)    Alkaline Phosphatase 29 (*)    GFR calc non Af Amer 85 (*)    All other components within normal limits  CBC WITH DIFFERENTIAL/PLATELET  LIPASE, BLOOD  URINALYSIS, ROUTINE W REFLEX MICROSCOPIC    Imaging Review No results found.   EKG Interpretation None       5:10 PM Patient seen and examined. Work-up reviewed. Medications ordered. They are understandably frustrated that patient is feeling badly and not able to eat, and not receiving any answers. I discussed with them the role of the Emergency Department and that I am unable to get a test like an EGD or MRCP acutely here, which she was told she needs by PCP today. At this point, will check UA, treat pain and nausea. Will give PO trial.   Vital signs reviewed and are as follows: BP 151/68 mmHg  Pulse 80  Temp(Src) 98.4 F (36.9 C) (Oral)  Resp 18  SpO2 99%  Discussed with  Dr. Rubin PayorPickering who will see patient.   5:40 PM UA is unremarkable, mild ketones. Pt receiving IVF.   8:32 PM Patient has done better during ED stay. Pain controlled with fentanyl. Patient informed of all results. Patient has tolerated fluids in the room without vomiting. Patient seen by Dr. Rubin PayorPickering. Will provide alternate GI referral. Patient otherwise instructed to continue Protonix, Carafate. She will continue to seek outpatient follow-up.  The patient was urged to return to the Emergency Department immediately with worsening of current symptoms, worsening abdominal pain, persistent vomiting, blood noted in stools, fever, or any other concerns. The patient verbalized understanding.    MDM   Final diagnoses:  Generalized abdominal pain  Right-sided thoracic back pain   Patient with acute on chronic abdominal pain, nausea and vomiting which is not intractable. Patient has had CT scan in the past week and normal lab workup 2. Symptomatic treatment today in ED with improvement.  She will likely need consideration for upper endoscopy by GI. Currently, no indications for inpatient admission. Do not feel that repeat imaging is indicated at this time. Patient is somewhat uncomfortable however has pain medication, PPI, Carafate at home. Feel patient stable for discharge with follow-up as discussed.   Renne CriglerJoshua Shizue Kaseman, PA-C 12/22/14 2034  Benjiman CoreNathan Pickering, MD 12/23/14 [phone]

## 2014-12-22 NOTE — ED Notes (Signed)
Called to update vital signs. No answer 

## 2014-12-22 NOTE — ED Notes (Signed)
Pt sts that she is having RUQ pain radiating into her back since this weekend, sts seen here Saturday. sts she hasn't been eating. sts some nausea.

## 2014-12-22 NOTE — Discharge Instructions (Signed)
Please read and follow all provided instructions.  Your diagnoses today include:  1. Generalized abdominal pain   2. Right-sided thoracic back pain     Tests performed today include:  Blood counts and electrolytes  Blood tests to check liver and kidney function  Blood tests to check pancreas function - no pancreatitis  Urine test to look for infection - no urine infection  Vital signs. See below for your results today.   Medications prescribed:   None   Take any prescribed medications only as directed.  Home care instructions:   Follow any educational materials contained in this packet.  Continue Protonix, carafate, and Vicodin prescribed at previous visit  Follow-up instructions: Please follow-up with your gastroenterologist in the next 7 days for further evaluation of your symptoms.    Return instructions:  SEEK IMMEDIATE MEDICAL ATTENTION IF:  The pain does not go away or becomes severe   A temperature above 101F develops   Repeated vomiting occurs (multiple episodes)   The pain becomes localized to portions of the abdomen. The right side could possibly be appendicitis. In an adult, the left lower portion of the abdomen could be colitis or diverticulitis.   Blood is being passed in stools or vomit (bright red or black tarry stools)   You develop chest pain, difficulty breathing, dizziness or fainting, or become confused, poorly responsive, or inconsolable (young children)  If you have any other emergent concerns regarding your health  Additional Information: Abdominal (belly) pain can be caused by many things. Your caregiver performed an examination and possibly ordered blood/urine tests and imaging (CT scan, x-rays, ultrasound). Many cases can be observed and treated at home after initial evaluation in the emergency department. Even though you are being discharged home, abdominal pain can be unpredictable. Therefore, you need a repeated exam if your pain does  not resolve, returns, or worsens. Most patients with abdominal pain don't have to be admitted to the hospital or have surgery, but serious problems like appendicitis and gallbladder attacks can start out as nonspecific pain. Many abdominal conditions cannot be diagnosed in one visit, so follow-up evaluations are very important.  Your vital signs today were: BP 167/83 mmHg   Pulse 70   Temp(Src) 98.5 F (36.9 C) (Oral)   Resp 16   SpO2 97% If your blood pressure (bp) was elevated above 135/85 this visit, please have this repeated by your doctor within one month. --------------

## 2015-01-02 ENCOUNTER — Ambulatory Visit
Admit: 2015-01-02 | Disposition: A | Discharge status: Home / Self Care | Payer: Self-pay | Attending: Gastroenterology | Admitting: Gastroenterology

## 2015-01-12 ENCOUNTER — Ambulatory Visit
Admit: 2015-01-12 | Disposition: A | Discharge status: Home / Self Care | Payer: Self-pay | Attending: Family Medicine | Admitting: Family Medicine

## 2015-02-23 DIAGNOSIS — M4724 Other spondylosis with radiculopathy, thoracic region: Secondary | ICD-10-CM | POA: Insufficient documentation

## 2015-02-23 DIAGNOSIS — M62830 Muscle spasm of back: Secondary | ICD-10-CM | POA: Insufficient documentation

## 2015-04-30 ENCOUNTER — Telehealth: Payer: Self-pay | Admitting: Gastroenterology

## 2015-04-30 DIAGNOSIS — R109 Unspecified abdominal pain: Secondary | ICD-10-CM

## 2015-04-30 MED ORDER — LORAZEPAM 1 MG PO TABS: 1.0000 mg | ORAL_TABLET | Freq: Two times a day (BID) | ORAL | Status: DC

## 2015-04-30 NOTE — Telephone Encounter (Signed)
Pt is requesting a refill on the lorazepam again. You did write her a rx the last time she requested due to a previous GI giving her this. Please advise if you want to continue doing this or not. She also received a refill for #20 at the end of April and the sig noted it for anxiety.

## 2015-04-30 NOTE — Telephone Encounter (Signed)
Patient would like her medication lorazepam called in for the Stomach pain, patient did mention she is doing much better with the help of the other medications but would like something for the stomach pains and she said the lorazepam helps. Please call into [facility] [address] [facility]

## 2015-04-30 NOTE — Telephone Encounter (Signed)
Please refill this medication for one more month and then tell her that she needs to have her primary care provider continue her on this medication because it is at of the scope of my practice.

## 2015-04-30 NOTE — Telephone Encounter (Signed)
New rx printed and put upfront for pt. LVM letting pt know this was ready. Cannot be faxed to the pharmacy.

## 2015-05-17 ENCOUNTER — Other Ambulatory Visit: Payer: Self-pay | Admitting: Gastroenterology

## 2015-10-12 ENCOUNTER — Other Ambulatory Visit: Payer: Self-pay | Admitting: Gastroenterology

## 2015-12-03 ENCOUNTER — Other Ambulatory Visit: Payer: Self-pay | Admitting: Family Medicine

## 2015-12-03 DIAGNOSIS — R1011 Right upper quadrant pain: Secondary | ICD-10-CM

## 2015-12-14 ENCOUNTER — Ambulatory Visit
Admission: RE | Admit: 2015-12-14 | Discharge: 2015-12-14 | Disposition: A | Discharge status: Home / Self Care | Payer: Medicare Other | Source: Ambulatory Visit | Attending: Family Medicine | Admitting: Family Medicine

## 2015-12-14 DIAGNOSIS — R1011 Right upper quadrant pain: Secondary | ICD-10-CM | POA: Diagnosis present

## 2015-12-14 DIAGNOSIS — Z9049 Acquired absence of other specified parts of digestive tract: Secondary | ICD-10-CM | POA: Insufficient documentation

## 2015-12-21 ENCOUNTER — Ambulatory Visit: Payer: Medicare Other

## 2016-03-02 ENCOUNTER — Other Ambulatory Visit: Payer: Self-pay | Admitting: Gastroenterology

## 2016-03-12 ENCOUNTER — Other Ambulatory Visit: Payer: Self-pay | Admitting: Gastroenterology

## 2016-08-03 DIAGNOSIS — I639 Cerebral infarction, unspecified: Secondary | ICD-10-CM

## 2016-08-03 HISTORY — DX: Cerebral infarction, unspecified: I63.9

## 2016-08-12 ENCOUNTER — Emergency Department: Payer: Medicare Other

## 2016-08-12 ENCOUNTER — Observation Stay
Admission: EM | Admit: 2016-08-12 | Discharge: 2016-08-13 | Disposition: A | Discharge status: Home / Self Care | Payer: Medicare Other | Attending: Internal Medicine | Admitting: Internal Medicine

## 2016-08-12 DIAGNOSIS — Z882 Allergy status to sulfonamides status: Secondary | ICD-10-CM | POA: Insufficient documentation

## 2016-08-12 DIAGNOSIS — K219 Gastro-esophageal reflux disease without esophagitis: Secondary | ICD-10-CM | POA: Insufficient documentation

## 2016-08-12 DIAGNOSIS — M810 Age-related osteoporosis without current pathological fracture: Secondary | ICD-10-CM | POA: Diagnosis not present

## 2016-08-12 DIAGNOSIS — F419 Anxiety disorder, unspecified: Secondary | ICD-10-CM | POA: Diagnosis not present

## 2016-08-12 DIAGNOSIS — Z7982 Long term (current) use of aspirin: Secondary | ICD-10-CM | POA: Diagnosis not present

## 2016-08-12 DIAGNOSIS — G4700 Insomnia, unspecified: Secondary | ICD-10-CM | POA: Diagnosis not present

## 2016-08-12 DIAGNOSIS — R262 Difficulty in walking, not elsewhere classified: Secondary | ICD-10-CM

## 2016-08-12 DIAGNOSIS — I080 Rheumatic disorders of both mitral and aortic valves: Secondary | ICD-10-CM | POA: Insufficient documentation

## 2016-08-12 DIAGNOSIS — M62830 Muscle spasm of back: Secondary | ICD-10-CM | POA: Diagnosis not present

## 2016-08-12 DIAGNOSIS — K589 Irritable bowel syndrome without diarrhea: Secondary | ICD-10-CM | POA: Diagnosis not present

## 2016-08-12 DIAGNOSIS — Z888 Allergy status to other drugs, medicaments and biological substances status: Secondary | ICD-10-CM | POA: Diagnosis not present

## 2016-08-12 DIAGNOSIS — G459 Transient cerebral ischemic attack, unspecified: Principal | ICD-10-CM

## 2016-08-12 DIAGNOSIS — R4781 Slurred speech: Secondary | ICD-10-CM

## 2016-08-12 DIAGNOSIS — Z87891 Personal history of nicotine dependence: Secondary | ICD-10-CM | POA: Diagnosis not present

## 2016-08-12 DIAGNOSIS — M6281 Muscle weakness (generalized): Secondary | ICD-10-CM

## 2016-08-12 DIAGNOSIS — G8921 Chronic pain due to trauma: Secondary | ICD-10-CM | POA: Insufficient documentation

## 2016-08-12 DIAGNOSIS — Z91040 Latex allergy status: Secondary | ICD-10-CM | POA: Diagnosis not present

## 2016-08-12 DIAGNOSIS — M545 Low back pain: Secondary | ICD-10-CM | POA: Insufficient documentation

## 2016-08-12 DIAGNOSIS — R278 Other lack of coordination: Secondary | ICD-10-CM

## 2016-08-12 HISTORY — DX: Anxiety disorder, unspecified: F41.9

## 2016-08-12 HISTORY — DX: Muscle spasm of back: M62.830

## 2016-08-12 HISTORY — DX: Diverticulosis of intestine, part unspecified, without perforation or abscess without bleeding: K57.90

## 2016-08-12 HISTORY — DX: Other chronic pain: G89.29

## 2016-08-12 HISTORY — DX: Gastro-esophageal reflux disease without esophagitis: K21.9

## 2016-08-12 HISTORY — DX: Age-related osteoporosis without current pathological fracture: M81.0

## 2016-08-12 HISTORY — DX: Insomnia, unspecified: G47.00

## 2016-08-12 HISTORY — DX: Irritable bowel syndrome, unspecified: K58.9

## 2016-08-12 HISTORY — DX: Dorsalgia, unspecified: M54.9

## 2016-08-12 LAB — COMPREHENSIVE METABOLIC PANEL
ALBUMIN: 4.2 g/dL (ref 3.5–5.0)
ALK PHOS: 32 U/L — AB (ref 38–126)
ALT: 11 U/L — ABNORMAL LOW (ref 14–54)
ANION GAP: 6 (ref 5–15)
AST: 16 U/L (ref 15–41)
BUN: 11 mg/dL (ref 6–20)
CALCIUM: 9.4 mg/dL (ref 8.9–10.3)
CO2: 28 mmol/L (ref 22–32)
Chloride: 105 mmol/L (ref 101–111)
Creatinine, Ser: 0.76 mg/dL (ref 0.44–1.00)
GFR calc Af Amer: >60 mL/min (ref >60)
GFR calc non Af Amer: >60 mL/min (ref >60)
GLUCOSE: 113 mg/dL — AB (ref 65–99)
Potassium: 3.5 mmol/L (ref 3.5–5.1)
SODIUM: 139 mmol/L (ref 135–145)
Total Bilirubin: 0.2 mg/dL — ABNORMAL LOW (ref 0.3–1.2)
Total Protein: 6.9 g/dL (ref 6.5–8.1)

## 2016-08-12 LAB — PROTIME-INR
INR: 0.93
PROTHROMBIN TIME: 12.5 s (ref 11.4–15.2)

## 2016-08-12 LAB — URINE DRUG SCREEN, QUALITATIVE (ARMC ONLY)
AMPHETAMINES, UR SCREEN: NOT DETECTED
BARBITURATES, UR SCREEN: NOT DETECTED
Benzodiazepine, Ur Scrn: NOT DETECTED
COCAINE METABOLITE, UR ~~LOC~~: NOT DETECTED
Cannabinoid 50 Ng, Ur ~~LOC~~: NOT DETECTED
MDMA (Ecstasy)Ur Screen: NOT DETECTED
METHADONE SCREEN, URINE: NOT DETECTED
OPIATE, UR SCREEN: NOT DETECTED
PHENCYCLIDINE (PCP) UR S: NOT DETECTED
Tricyclic, Ur Screen: NOT DETECTED

## 2016-08-12 LAB — CBC
HCT: 39.6 % (ref 35.0–47.0)
HEMOGLOBIN: 13 g/dL (ref 12.0–16.0)
MCH: 31.1 pg (ref 26.0–34.0)
MCHC: 32.9 g/dL (ref 32.0–36.0)
MCV: 94.3 fL (ref 80.0–100.0)
PLATELETS: 227 10*3/uL (ref 150–440)
RBC: 4.2 MIL/uL (ref 3.80–5.20)
RDW: 13.9 % (ref 11.5–14.5)
WBC: 6.8 10*3/uL (ref 3.6–11.0)

## 2016-08-12 LAB — ETHANOL: Alcohol, Ethyl (B): <5 mg/dL (ref <5)

## 2016-08-12 LAB — LIPID PANEL
Cholesterol: 212 mg/dL — ABNORMAL HIGH (ref 0–200)
HDL: 77 mg/dL (ref >40)
LDL Cholesterol: 127 mg/dL — ABNORMAL HIGH (ref 0–99)
Total CHOL/HDL Ratio: 2.8 RATIO
Triglycerides: 42 mg/dL (ref <150)
VLDL: 8 mg/dL (ref 0–40)

## 2016-08-12 LAB — DIFFERENTIAL
BASOS ABS: 0.1 10*3/uL (ref 0–0.1)
Basophils Relative: 1 %
EOS PCT: 0 %
Eosinophils Absolute: 0 10*3/uL (ref 0–0.7)
LYMPHS ABS: 1.3 10*3/uL (ref 1.0–3.6)
LYMPHS PCT: 20 %
Monocytes Absolute: 0.4 10*3/uL (ref 0.2–0.9)
Monocytes Relative: 6 %
NEUTROS ABS: 5 10*3/uL (ref 1.4–6.5)
NEUTROS PCT: 73 %

## 2016-08-12 LAB — GLUCOSE, CAPILLARY: Glucose-Capillary: 98 mg/dL (ref 65–99)

## 2016-08-12 LAB — APTT: APTT: 26 s (ref 24–36)

## 2016-08-12 MED ORDER — PANTOPRAZOLE SODIUM 40 MG PO TBEC
40.0000 mg | DELAYED_RELEASE_TABLET | Freq: Every day | ORAL | Status: DC
  Administered 2016-08-13: 12:00:00 40 mg via ORAL
  Filled 2016-08-12: qty 1

## 2016-08-12 MED ORDER — LORAZEPAM 1 MG PO TABS
1.0000 mg | ORAL_TABLET | Freq: Two times a day (BID) | ORAL | Status: DC
Start: 2016-08-12 — End: 2016-08-13
  Administered 2016-08-12: 2 mg via ORAL
  Filled 2016-08-12: qty 2
  Filled 2016-08-12: qty 1

## 2016-08-12 MED ORDER — ASPIRIN 81 MG PO CHEW: 324.0000 mg | CHEWABLE_TABLET | Freq: Once | ORAL | Status: DC

## 2016-08-12 MED ORDER — VITAMIN D 1000 UNITS PO TABS
1000.0000 [IU] | ORAL_TABLET | Freq: Every day | ORAL | Status: DC
  Administered 2016-08-13: 1000 [IU] via ORAL
  Filled 2016-08-12: qty 1

## 2016-08-12 MED ORDER — ACETAMINOPHEN 650 MG RE SUPP: 650.0000 mg | Freq: Four times a day (QID) | RECTAL | Status: DC | PRN

## 2016-08-12 MED ORDER — TRAZODONE HCL 100 MG PO TABS
100.0000 mg | ORAL_TABLET | Freq: Every evening | ORAL | Status: DC | PRN
  Administered 2016-08-12: 22:00:00 100 mg via ORAL
  Filled 2016-08-12: qty 1

## 2016-08-12 MED ORDER — HYDROCODONE-ACETAMINOPHEN 5-325 MG PO TABS: 1.0000 | ORAL_TABLET | Freq: Four times a day (QID) | ORAL | Status: DC | PRN

## 2016-08-12 MED ORDER — DULOXETINE HCL 60 MG PO CPEP
60.0000 mg | ORAL_CAPSULE | Freq: Every day | ORAL | Status: DC
  Administered 2016-08-13: 60 mg via ORAL
  Filled 2016-08-12: qty 1

## 2016-08-12 MED ORDER — ASPIRIN EC 81 MG PO TBEC
81.0000 mg | DELAYED_RELEASE_TABLET | Freq: Every day | ORAL | Status: DC
  Administered 2016-08-13: 81 mg via ORAL
  Filled 2016-08-12: qty 1

## 2016-08-12 MED ORDER — DICYCLOMINE HCL 20 MG PO TABS
20.0000 mg | ORAL_TABLET | Freq: Three times a day (TID) | ORAL | Status: DC
  Administered 2016-08-12 – 2016-08-13 (×3): 20 mg via ORAL
  Filled 2016-08-12 (×3): qty 1

## 2016-08-12 MED ORDER — ATORVASTATIN CALCIUM 20 MG PO TABS
40.0000 mg | ORAL_TABLET | Freq: Every day | ORAL | Status: DC
  Administered 2016-08-12 – 2016-08-13 (×2): 40 mg via ORAL
  Filled 2016-08-12 (×3): qty 2

## 2016-08-12 MED ORDER — ACETAMINOPHEN 325 MG PO TABS: 650.0000 mg | ORAL_TABLET | Freq: Four times a day (QID) | ORAL | Status: DC | PRN

## 2016-08-12 MED ORDER — ADULT MULTIVITAMIN W/MINERALS CH
1.0000 | ORAL_TABLET | Freq: Every day | ORAL | Status: DC
  Administered 2016-08-13: 12:00:00 1 via ORAL
  Filled 2016-08-12: qty 1

## 2016-08-12 MED ORDER — SODIUM CHLORIDE 0.9% FLUSH
3.0000 mL | Freq: Two times a day (BID) | INTRAVENOUS | Status: DC
  Administered 2016-08-13: 3 mL via INTRAVENOUS

## 2016-08-12 MED ORDER — SODIUM CHLORIDE 0.9 % IV SOLN
INTRAVENOUS | Status: DC
  Administered 2016-08-12: 18:00:00 via INTRAVENOUS

## 2016-08-12 MED ORDER — DIPHENHYDRAMINE HCL 25 MG PO CAPS: 25.0000 mg | ORAL_CAPSULE | Freq: Every evening | ORAL | Status: DC | PRN

## 2016-08-12 MED ORDER — ASPIRIN 81 MG PO CHEW
CHEWABLE_TABLET | ORAL | Status: AC
  Filled 2016-08-12: qty 4

## 2016-08-12 MED ORDER — ONDANSETRON HCL 4 MG/2ML IJ SOLN: 4.0000 mg | Freq: Four times a day (QID) | INTRAMUSCULAR | Status: DC | PRN

## 2016-08-12 MED ORDER — LORAZEPAM 2 MG/ML IJ SOLN
2.0000 mg | Freq: Once | INTRAMUSCULAR | Status: AC
  Administered 2016-08-12: 2 mg via INTRAVENOUS
  Filled 2016-08-12: qty 1

## 2016-08-12 MED ORDER — ONDANSETRON HCL 4 MG PO TABS: 4.0000 mg | ORAL_TABLET | Freq: Four times a day (QID) | ORAL | Status: DC | PRN

## 2016-08-12 MED ORDER — DOCUSATE SODIUM 100 MG PO CAPS
100.0000 mg | ORAL_CAPSULE | Freq: Two times a day (BID) | ORAL | Status: DC
  Administered 2016-08-12 – 2016-08-13 (×2): 100 mg via ORAL
  Filled 2016-08-12 (×2): qty 1

## 2016-08-12 MED ORDER — RENA-VITE PO TABS
1.0000 | ORAL_TABLET | Freq: Every day | ORAL | Status: DC
  Administered 2016-08-12 – 2016-08-13 (×2): 1 via ORAL
  Filled 2016-08-12 (×2): qty 1

## 2016-08-12 MED ORDER — TRAMADOL HCL 50 MG PO TABS: 50.0000 mg | ORAL_TABLET | Freq: Four times a day (QID) | ORAL | Status: DC | PRN

## 2016-08-12 MED ORDER — ENOXAPARIN SODIUM 40 MG/0.4ML ~~LOC~~ SOLN
40.0000 mg | SUBCUTANEOUS | Status: DC
  Administered 2016-08-12: 22:00:00 40 mg via SUBCUTANEOUS
  Filled 2016-08-12: qty 0.4

## 2016-08-12 MED ORDER — ASPIRIN 81 MG PO CHEW
324.0000 mg | CHEWABLE_TABLET | Freq: Once | ORAL | Status: AC
  Administered 2016-08-12: 324 mg via ORAL

## 2016-08-12 NOTE — Progress Notes (Signed)
[facility] was paged for Code STROKE. When [facility] PT was returning to ED room and was alert. [facility] services not needed at this time.

## 2016-08-12 NOTE — ED Triage Notes (Signed)
See ed narrator note
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
## 2016-08-12 NOTE — ED Provider Notes (Signed)
[facility] Emergency Department Provider Note   ____________________________________________   I have reviewed the triage vital signs and the nursing notes.   HISTORY  Chief Complaint Aphasia   History limited by: Not Limited   HPI Ashley Murillo is a 70 y.o. female who presents to the emergency department today because of concern for difficulty with speech and thought. The patient was at work today when it started. She felt like she was having a hard time getting her thoughts out and performing tasks. She additionally felt diffuse weakness throughout her extremities. At the time of my exam the patient felt like she was improving however not back to her baseline. The patient has not had any recent illness. She has never had similar symptoms like that in the past.    No past medical history on file.  Patient Active Problem List   Diagnosis Date Noted  . Back muscle spasm 02/23/2015  . Thoracic spondylosis with radiculopathy 02/23/2015  . 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 Anxiety state 04/21/2014  . Chronic pain 04/21/2014  . Acid reflux 04/21/2014  . Cannot sleep 04/21/2014  . OP (osteoporosis) 04/21/2014    No past surgical history on file.  Prior to Admission medications   Medication Sig Start Date End Date Taking? Authorizing Provider  b complex vitamins tablet Take 1 tablet by mouth daily.    Historical Provider, MD  BESIVANCE 0.6 % SUSP INSTILL 1 DROP IN RIGHT EYE THREE TIMES A DAY 03/19/15   Historical Provider, MD  bisacodyl (DULCOLAX) 5 MG EC tablet Take 10 mg by mouth daily as needed for moderate constipation (constipation).    Historical Provider, MD  CALCIUM PO Take 1 tablet by mouth daily.    Historical Provider, MD  Cholecalciferol (VITAMIN D PO) Take 1 tablet by mouth daily.    Historical Provider, MD  dicyclomine (BENTYL) 20 MG tablet TAKE 1 TABLET BY MOUTH 3 TIMES A DAY 03/14/16   Midge Miniumarren Wohl, MD  diphenhydrAMINE (BENADRYL)  25 mg capsule Take 25 mg by mouth at bedtime as needed for allergies.     Historical Provider, MD  DULoxetine (CYMBALTA) 60 MG capsule Take 60 mg by mouth daily. 11/17/14   Historical Provider, MD  DUREZOL 0.05 % EMUL INSTILL 1 DROP IN RIGHT EYE THREE TIMES A DAY 03/19/15   Historical Provider, MD  HYDROcodone-acetaminophen (NORCO/VICODIN) 5-325 MG per tablet Take 1-2 tablets every 6 hours as needed for severe pain 12/22/14   Renne CriglerJoshua Geiple, PA-C  ILEVRO 0.3 % ophthalmic suspension INSTILL 1 DROP IN RIGHT EYE AT BEDTIME 03/19/15   Historical Provider, MD  latanoprost (XALATAN) 0.005 % ophthalmic solution Place 1 drop into the left eye at bedtime.  12/01/14   Historical Provider, MD  LINZESS 145 MCG CAPS capsule Take 145 mcg by mouth daily. 02/15/15   Historical Provider, MD  LORazepam (ATIVAN) 1 MG tablet Take 1-2 tablets (1-2 mg total) by mouth 2 (two) times daily. 04/30/15   Midge Miniumarren Wohl, MD  Multiple Vitamin (MULTIVITAMIN WITH MINERALS) TABS tablet Take 1 tablet by mouth daily.    Historical Provider, MD  ondansetron (ZOFRAN) 8 MG tablet Take 8 mg by mouth every 8 (eight) hours as needed for nausea or vomiting.     Historical Provider, MD  pantoprazole (PROTONIX) 40 MG tablet Take 1 tablet (40 mg total) by mouth daily. 12/20/14   Gerhard Munchobert Lockwood, MD  sucralfate (CARAFATE) 1 GM/10ML suspension Take 10 mLs (1 g total) by mouth 4 (four) times daily -  with meals and at bedtime. 12/20/14   Gerhard Munchobert Lockwood, MD  traMADol (ULTRAM) 50 MG tablet Take 50 mg by mouth 2 (two) times daily as needed. 04/09/15   Historical Provider, MD  traZODone (DESYREL) 100 MG tablet Take 100 mg by mouth at bedtime as needed for sleep.  12/01/14   Historical Provider, MD  triamcinolone cream (KENALOG) 0.1 % Apply 1 application topically 2 (two) times daily as needed (itching).     Historical Provider, MD  TURMERIC PO Take 300 mg by mouth 2 (two) times daily.    Historical Provider, MD  zoledronic acid (RECLAST) 5 MG/100ML SOLN injection  Inject 5 mg into the vein once. Yearly - last injection January 2015    Historical Provider, MD    Allergies Sulfa antibiotics; Tape; and Latex  No family history on file.  Social History Social History  Substance Use Topics  . Smoking status: Never Smoker  . Smokeless tobacco: Not on file  . Alcohol use No    Review of Systems  Constitutional: Negative for fever. Cardiovascular: Negative for chest pain. Respiratory: Negative for shortness of breath. Gastrointestinal: Negative for abdominal pain, vomiting and diarrhea. Genitourinary: Negative for dysuria. Musculoskeletal: Negative for back pain. Skin: Negative for rash. Neurological: Difficulty with speech and thought.   10-point ROS otherwise negative.  ____________________________________________   PHYSICAL EXAM:  Constitutional: Alert and oriented. Well appearing and in no distress. Eyes: Conjunctivae are normal. Normal extraocular movements. ENT   Head: Normocephalic and atraumatic.   Nose: No congestion/rhinnorhea.   Mouth/Throat: Mucous membranes are moist.   Neck: No stridor. Hematological/Lymphatic/Immunilogical: No cervical lymphadenopathy. Cardiovascular: Normal rate, regular rhythm.  No murmurs, rubs, or gallops.  Respiratory: Normal respiratory effort without tachypnea nor retractions. Breath sounds are clear and equal bilaterally. No wheezes/rales/rhonchi. Gastrointestinal: Soft and nontender. No distention.  Genitourinary: Deferred Musculoskeletal: Normal range of motion in all extremities. No lower extremity edema. Neurologic:  Question some slurred speech. Face symmetric. Tongue midline. No pronator drift. Strength 5/5 in upper and lower extremities. No gross focal neurologic deficits are appreciated.  Skin:  Skin is warm, dry and intact. No rash noted. Psychiatric: Mood and affect are normal. Speech and behavior are normal. Patient exhibits appropriate insight and  judgment.  ____________________________________________    LABS (pertinent positives/negatives)  Labs Reviewed  COMPREHENSIVE METABOLIC PANEL - Abnormal; Notable for the following:       Result Value   Glucose, Bld 113 (*)    ALT 11 (*)    Alkaline Phosphatase 32 (*)    Total Bilirubin 0.2 (*)    All other components within normal limits  LIPID PANEL - Abnormal; Notable for the following:    Cholesterol 212 (*)    LDL Cholesterol 127 (*)    All other components within normal limits  ETHANOL  PROTIME-INR  APTT  CBC  DIFFERENTIAL  GLUCOSE, CAPILLARY  URINE DRUG SCREEN, QUALITATIVE ([facility] ONLY)     ____________________________________________   EKG  I, Phineas SemenGraydon Namiyah Grantham, attending physician, personally viewed and interpreted this EKG  EKG Time: 1408 Rate: 71 Rhythm: normal sinus rhythm Axis: normal Intervals: qtc 485 QRS: narrow ST changes: no st elevation Impression: normal ekg   ____________________________________________    RADIOLOGY  CT head IMPRESSION: 1. No acute intracranial abnormality. Mild atrophy. No change from the prior study 2. ASPECTS is 10  I, Reymundo Winship, personally discussed these images and results by phone with the on-call radiologist and used this discussion as part of my medical decision making.  ____________________________________________   PROCEDURES  Procedures  ____________________________________________   INITIAL IMPRESSION / ASSESSMENT AND PLAN / ED COURSE  Pertinent labs & imaging results that were available during my care of the patient were reviewed by me and considered in my medical decision making (see chart for details).  Patient presented to the emergency department today because of concerns for some weakness in difficulty with her speech and thinking. On my exam patient did have perhaps some slurred speech although appeared to be mentating fine. No extremity weakness. Patient was seen by telemetry  neurology. At this point they recommend admission for possible TIA. ____________________________________________   FINAL CLINICAL IMPRESSION(S) / ED DIAGNOSES  Final diagnoses:  Slurred speech  Transient cerebral ischemia, unspecified type     Note: This dictation was prepared with Dragon dictation. Any transcriptional errors that result from this process are unintentional    Phineas Semen, MD 08/12/16 [phone]

## 2016-08-12 NOTE — ED Notes (Addendum)
Pt appears to be forgetful and repeatedly asking this nurse and Art therapistAmber RN the same questions with no knowledge that she had already ask the question and received the answer - pt is also having difficulty searching for words to use but if given time she can recall the appropriate word to use - speech is clear - no strength deficit at this time - spouse at bedside - MD Derrill KayGoodman notified

## 2016-08-12 NOTE — ED Notes (Signed)
Pt c/o weakness and "head not clear" - she states that she was unable to keep a clear thought pattern - states she had "sound of crinkling paper" in her head - pt reports that she is feeling better since the onset of symptoms - states she is having a hard time expressing thoughts

## 2016-08-12 NOTE — ED Triage Notes (Signed)
Per [name] RN from [facility], Patient c/o slurred speech, difficulty performing mental tasks such as remembering and dialing a phone and extremity weakness since 1230 today. Patient's husband states it began at 1130 today. Patient taken to CT scan.

## 2016-08-12 NOTE — ED Notes (Signed)
Called SOC initiated code stroke 1342

## 2016-08-13 ENCOUNTER — Observation Stay (HOSPITAL_BASED_OUTPATIENT_CLINIC_OR_DEPARTMENT_OTHER)
Admit: 2016-08-13 | Discharge: 2016-08-13 | Disposition: A | Discharge status: Home / Self Care | Payer: Medicare Other | Attending: Internal Medicine | Admitting: Internal Medicine

## 2016-08-13 ENCOUNTER — Observation Stay: Payer: Medicare Other

## 2016-08-13 DIAGNOSIS — G459 Transient cerebral ischemic attack, unspecified: Secondary | ICD-10-CM | POA: Diagnosis not present

## 2016-08-13 LAB — BASIC METABOLIC PANEL
ANION GAP: 4 — AB (ref 5–15)
BUN: 9 mg/dL (ref 6–20)
CALCIUM: 8.8 mg/dL — AB (ref 8.9–10.3)
CO2: 29 mmol/L (ref 22–32)
Chloride: 109 mmol/L (ref 101–111)
Creatinine, Ser: 0.68 mg/dL (ref 0.44–1.00)
GFR calc Af Amer: >60 mL/min (ref >60)
GFR calc non Af Amer: >60 mL/min (ref >60)
GLUCOSE: 95 mg/dL (ref 65–99)
POTASSIUM: 4.5 mmol/L (ref 3.5–5.1)
Sodium: 142 mmol/L (ref 135–145)

## 2016-08-13 LAB — CBC
HEMATOCRIT: 36.7 % (ref 35.0–47.0)
HEMOGLOBIN: 12.6 g/dL (ref 12.0–16.0)
MCH: 31.7 pg (ref 26.0–34.0)
MCHC: 34.3 g/dL (ref 32.0–36.0)
MCV: 92.5 fL (ref 80.0–100.0)
Platelets: 210 10*3/uL (ref 150–440)
RBC: 3.97 MIL/uL (ref 3.80–5.20)
RDW: 13.6 % (ref 11.5–14.5)
WBC: 5.1 10*3/uL (ref 3.6–11.0)

## 2016-08-13 MED ORDER — ASPIRIN 81 MG PO TBEC: 81.0000 mg | DELAYED_RELEASE_TABLET | Freq: Every day | ORAL | 0 refills | Status: DC

## 2016-08-13 MED ORDER — ATORVASTATIN CALCIUM 40 MG PO TABS: 40.0000 mg | ORAL_TABLET | Freq: Every day | ORAL | 0 refills | Status: DC

## 2016-08-13 NOTE — Care Management Note (Signed)
Case Management Note  Patient Details  Name: Ashley SheriffRosemarie Fife MRN: [id number] Date of Birth: [birth year]  Subjective/Objective:      A referral for home health PT, OT, RN was called to [facility] Stakes[facility] per [facility] had [facility] the past. A referral for a RW was faxed to [facility] with a request for it to be delivered to Ms Trzcinski's hospital room today. Family advised that delivery may be after 5pm.               Action/Plan:   Expected Discharge Date:  08/13/16               Expected Discharge Plan:     In-House Referral:     Discharge planning Services     Post Acute Care Choice:    Choice offered to:     DME Arranged:    DME Agency:     HH Arranged:    HH Agency:     Status of Service:     If discussed at [employer] Meetings, dates discussed:    Additional Comments:  Serigne Kubicek A, RN 08/13/2016, 2:07 PM

## 2016-08-13 NOTE — Clinical Social Work Note (Signed)
CSW received consult concerning dc planning for patient. The patient has decided to return home due to insurance refusal to pay for SNF [facility]. CSW signing off.  Ashley PonderKaren Martha Jawara Murillo, MSW, LCSW-A [phone]

## 2016-08-13 NOTE — Progress Notes (Signed)
[facility] Discharge instructions given and explained to pt and spouse. Both verbalized understanding. F/U appointment and meds reviewed with pt. Awaiting a walker to be arrived to room.
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
## 2016-08-13 NOTE — Evaluation (Signed)
Occupational Therapy Evaluation Patient Details Name: Ashley Murillo MRN: [id number] DOB: October 15, 1945 Today's Date: 08/13/2016    History of Present Illness Patient reports she was at work and had a hard time speaking and getting her words out, reported right sided weakness, was transported to the hospital and admitted for TIA.  CT and MRI were negative.      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  Patient lives at home with her husband in a one story home with 2 steps to enter.  She was previously independent with all self care tasks, IADLs including driving, working part time as a Interior and spatial designer and homemaking tasks.  She was evaluated this date and presents with disorientation, slow to process information and commands, slow to respond to 1 and 2 step instructions.  Patient reports dizziness this am with sitting and standing and could not tolerate activities at the sink or attempts to ambulate to the bathroom.  She was unable to perform lower body dressing skills during session. She demonstrated decreased active ROM in her shoulders bilaterally, decreased strength and impaired coordination with extremely slow movements which may be more related to cognition this date since it was bilateral.  Recommend continued OT to maximize safety and independence in daily tasks to return home with husband.  Patient may benefit from short term rehab prior to returning home if her cognitive status remains impaired and if she cannot safely perform basic self care tasks.     Follow Up Recommendations  SNF    Equipment Recommendations       Recommendations for Other Services       Precautions / Restrictions Precautions Precautions: Fall Restrictions Weight Bearing Restrictions: No Other Position/Activity Restrictions: Patient complains of dizziness upon sitting or standing      Mobility  Bed Mobility Overal bed mobility: Needs Assistance Bed Mobility: Sit to Supine;Supine to Sit     Supine to sit: Min guard Sit to supine: Min assist      Transfers Overall transfer level: Needs assistance   Transfers: Sit to/from Stand Sit to Stand: Min assist;Min guard         General transfer comment: Min guard to stand from bed however felt weak and dizzy and required min assist to return to sitting position.    Balance                                            ADL Overall ADL's : Needs assistance/impaired Eating/Feeding: Independent   Grooming: Minimal assistance   Upper Body Bathing: Minimal assitance   Lower Body Bathing: Maximal assistance Lower Body Bathing Details (indicate cue type and reason): Unable to reach towards her feet this date, complains of dizziness.  Upper Body Dressing : Minimal assistance   Lower Body Dressing: Maximal assistance Lower Body Dressing Details (indicate cue type and reason): Unable to don or doff socks this date upon request.   Toilet Transfer: Minimal assistance   Toileting- Clothing Manipulation and Hygiene: Minimal assistance       Functional mobility during ADLs: Minimal assistance General ADL Comments: Patient required CGA to stand but complained of dizziness and weakness and had to sit quickly back onto the bed.  It is difficult to determine at this time her true  physical limitations due to slow processing and cognition with increased disorientation this morning.  Her husband reports this is unusual for her and is worried about her going home.       Vision Vision Assessment?: No apparent visual deficits   Perception     Praxis      Pertinent Vitals/Pain Pain Assessment: No/denies pain     Hand Dominance Right   Extremity/Trunk Assessment Upper Extremity Assessment Upper Extremity Assessment: Generalized weakness;RUE deficits/detail RUE Deficits / Details: Patient complains of right UE  weakness, difficult to assess this date due to slow processing and cognition.  She demonstrates around 90 degrees of shoulder flexion bilaterally, weakness noted in bilateral UE with strength testing.  Slow with finger to nose and rapid alternating movements.  Husband reports her performance is unusual for her, she normally responds quicker to requests.  RUE Coordination: decreased fine motor;decreased gross motor   Lower Extremity Assessment Lower Extremity Assessment: Defer to PT evaluation       Communication Communication Communication: Other (comment) (appears slow to process and respond to requests.  )   Cognition Arousal/Alertness: Lethargic Behavior During Therapy: WFL for tasks assessed/performed Overall Cognitive Status: Impaired/Different from baseline Area of Impairment: Attention;Memory;Following commands;Orientation Orientation Level: Disoriented to     Following Commands: Follows one step commands with increased time       General Comments: Patient appears lethargic, reports feeling disoriented and [place]  Slow to respond, able to follow one step commands   General Comments       Exercises       Shoulder Instructions      Home Living Family/patient expects to be discharged to:: Private residence Living Arrangements: Spouse/significant other Available Help at Discharge: Family Type of Home: House Home Access: Stairs to enter Secretary/administratorntrance Stairs-Number of Steps: 2   Home Layout: One level     Bathroom Shower/Tub: Walk-in Pensions consultantshower;Curtain   Bathroom Toilet: Handicapped height Bathroom Accessibility: Yes   Home Equipment: Shower seat   Additional Comments: Patient was slow to respond to questions and complains of disorientation and therefore it is uncertain she was able to recall a comprehensive list of equipment.       Prior Functioning/Environment Level of Independence: Independent                  OT Problem List: Decreased strength;Decreased range of motion;Impaired balance (sitting and/or standing);Decreased cognition;Decreased safety awareness;Decreased activity tolerance;Decreased coordination;Decreased knowledge of use of DME or AE   OT Treatment/Interventions: Self-care/ADL training;DME and/or AE instruction;Therapeutic activities;Therapeutic exercise;Cognitive remediation/compensation;Neuromuscular education;Patient/family education    OT Goals(Current goals can be found in the care plan section) Acute Rehab OT Goals Patient Stated Goal: Patient reports she wants to get back to normal. OT Goal Formulation: With patient Time For Goal Achievement: 08/27/16 Potential to Achieve Goals: Good  OT Frequency: Min 1X/week   Barriers to D/C:    Disorientation, dizziness and slow to respond/process information which can affect safety.       Co-evaluation              End of Session Equipment Utilized During Treatment: Gait belt  Activity Tolerance: Patient limited by fatigue;Patient limited by lethargy Patient left: in bed;with call bell/phone within reach;with bed alarm set;with family/visitor present   Time: [id number] OT Time Calculation (min): 31 min Charges:  OT General Charges $OT Visit: 1 Procedure OT Evaluation $OT Eval Low Complexity: 1 Procedure OT Treatments $Self Care/Home Management : 8-22  mins G-Codes: OT G-codes **NOT FOR INPATIENT CLASS** Functional Assessment Tool Used: clinical judgment, ADL assessment, ROM/strength testing Functional Limitation: Self care Self Care Current Status (Z6109(G8987): At least 60 percent but less than 80 percent impaired, limited or restricted Self Care Goal Status (U0454(G8988): At least 20 percent but less than 40 percent impaired, limited or restricted  [employer],Calea Hribar  Gionni Vaca T Ryman Rathgeber, OTR/L, CLT  08/13/2016, 9:58 AM

## 2016-08-13 NOTE — Evaluation (Signed)
Physical Therapy Evaluation Patient Details Name: Ashley Murillo MRN: [id number] DOB: 1946-06-01 Today's Date: 08/13/2016   History of Present Illness  Patient reports she was at work and had a hard time speaking and getting her words out, reported right sided weakness, was transported to the hospital and admitted for TIA.  CT and MRI were negative.     Clinical Impression  Pt is lethargic, weak and very slow with responses.  She reports having to be very deliberate with all she does and generally reports being "foggy" t/o the session.  She was able to do most mobility acts w/o assist but was reliant on the walker for safety during ambulation (multiple LOBs and general unsteadiness walking w/o AD).  She is not at all near her baseline and does not seem to have a lot of resolution of symptoms but she and her husband feel she will be safe at home and are wanting to go today if at all possible.     Follow Up Recommendations Home health PT    Equipment Recommendations       Recommendations for Other Services       Precautions / Restrictions Restrictions Weight Bearing Restrictions: No      Mobility  Bed Mobility Overal bed mobility: Modified Independent Bed Mobility: Supine to Sit;Sit to Supine     Supine to sit: Min guard Sit to supine: Min guard   General bed mobility comments: Pt was able to get in/out of bed without direct assist but was very slow and labored with the effort  Transfers Overall transfer level: Modified independent Equipment used: Rolling walker (2 wheeled) Transfers: Sit to/from Stand Sit to Stand: Min guard         General transfer comment: Pt shows poor confidence and stability with getting to standing, but with cuing (and walker to maintain balance) did so w/o direct assist  Ambulation/Gait Ambulation/Gait assistance: Min assist Ambulation Distance (Feet): 75 Feet Assistive device: Rolling walker (2 wheeled);None       General Gait Details: Pt  was slow, guarded and generally unsteady with ambulation.  She did not have any LOBs and was relatively safe with the walker (and ~20 ft with the hallway rail) but with ~30 ft of ambulation w/o walker she was staggering, uncoordinated and generally not safe.  Encouraged her to use the walker or husband for steadiness for a while  Stairs            Wheelchair Mobility    Modified Rankin (Stroke Patients Only)       Balance Overall balance assessment: Needs assistance   Sitting balance-Leahy Scale: Fair       Standing balance-Leahy Scale: Poor                               Pertinent Vitals/Pain Pain Assessment: No/denies pain    Home Living Family/patient expects to be discharged to:: Private residence Living Arrangements: Spouse/significant other Available Help at Discharge: Family   Home Access: Stairs to enter   Secretary/administratorntrance Stairs-Number of Steps: 2 Home Layout: One level Home Equipment: Shower seat      Prior Function Level of Independence: Independent         Comments: Pt works as a Producer, television/film/videohair dresser, is typically able to be very active and mobile     Hand Dominance        Extremity/Trunk Assessment   Upper Extremity Assessment: Defer to OT evaluation  Lower Extremity Assessment: Generalized weakness (pt with symmetrical general weakness in b/l LEs)         Communication   Communication:  (slow, effortful verbalization)  Cognition Arousal/Alertness: Lethargic Behavior During Therapy: WFL for tasks assessed/performed Overall Cognitive Status: Impaired/Different from baseline Area of Impairment: Attention;Memory;Following commands;Orientation                    General Comments      Exercises     Assessment/Plan    PT Assessment Patient needs continued PT services  PT Problem List Decreased strength;Decreased range of motion;Decreased activity tolerance;Decreased balance;Decreased mobility;Decreased  coordination;Decreased knowledge of use of DME;Decreased safety awareness;Decreased cognition          PT Treatment Interventions DME instruction;Gait training;Stair training;Functional mobility training;Therapeutic activities;Therapeutic exercise;Balance training;Neuromuscular re-education;Cognitive remediation;Patient/family education    PT Goals (Current goals can be found in the Care Plan section)  Acute Rehab PT Goals Patient Stated Goal: "I want to go home today" PT Goal Formulation: With patient/family Time For Goal Achievement: 08/20/16 Potential to Achieve Goals: Fair    Frequency Min 2X/week   Barriers to discharge        Co-evaluation               End of Session Equipment Utilized During Treatment: Gait belt Activity Tolerance: Patient limited by lethargy;Patient tolerated treatment well Patient left: with call bell/phone within reach;with bed alarm set;with family/visitor present Nurse Communication: Mobility status;Precautions    Functional Assessment Tool Used: clinical judgement Functional Limitation: Mobility: Walking and moving around Mobility: Walking and Moving Around Current Status ([id number](G8978): At least 20 percent but less than 40 percent impaired, limited or restricted Mobility: Walking and Moving Around Goal Status [phone]([id number]): At least 1 percent but less than 20 percent impaired, limited or restricted    Time: 1150-1214 PT Time Calculation (min) (ACUTE ONLY): 24 min   Charges:   PT Evaluation $PT Eval Low Complexity: 1 Procedure     PT G Codes:   PT G-Codes **NOT FOR INPATIENT CLASS** Functional Assessment Tool Used: clinical judgement Functional Limitation: Mobility: Walking and moving around Mobility: Walking and Moving Around Current Status ([id number](G8978): At least 20 percent but less than 40 percent impaired, limited or restricted Mobility: Walking and Moving Around Goal Status [phone]([id number]): At least 1 percent but less than 20 percent impaired, limited or  restricted    Malachi ProGalen R Liliana Brentlinger, DPT 08/13/2016, 2:11 PM

## 2016-08-13 NOTE — Plan of Care (Signed)
Problem: Education: Goal: Knowledge of [facility] Education information/materials will improve Outcome: Progressing VS WDL, free of falls during shift.  Pt asleep majority of shift, arouses easily for q4h neuro checks, PM meds.  Pt ambulated to BR w/ NA during shift, unsteady gait.  No complaints overnight.  Bed in low position, bed alarm on.  Call bell within reach, WCTM.
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
## 2016-08-13 NOTE — Progress Notes (Signed)
Pt left prior to walker arrival. Per spouse he will come back tomorrow 08/14/16 to the unit to take the walker.

## 2016-09-09 ENCOUNTER — Other Ambulatory Visit: Payer: Self-pay | Admitting: Gastroenterology

## 2016-09-23 ENCOUNTER — Encounter: Payer: Self-pay | Admitting: Anesthesiology

## 2016-09-23 ENCOUNTER — Ambulatory Visit: Payer: Medicare Other | Attending: Anesthesiology | Admitting: Anesthesiology

## 2016-09-23 VITALS — BP 139/59 | HR 75 | Temp 98.2°F | Resp 16 | Ht 60.0 in | Wt 130.0 lb

## 2016-09-23 DIAGNOSIS — M25569 Pain in unspecified knee: Secondary | ICD-10-CM | POA: Diagnosis not present

## 2016-09-23 DIAGNOSIS — G8929 Other chronic pain: Secondary | ICD-10-CM | POA: Diagnosis not present

## 2016-09-23 DIAGNOSIS — Z8673 Personal history of transient ischemic attack (TIA), and cerebral infarction without residual deficits: Secondary | ICD-10-CM | POA: Diagnosis not present

## 2016-09-23 DIAGNOSIS — Z79899 Other long term (current) drug therapy: Secondary | ICD-10-CM | POA: Insufficient documentation

## 2016-09-23 DIAGNOSIS — M47816 Spondylosis without myelopathy or radiculopathy, lumbar region: Secondary | ICD-10-CM

## 2016-09-23 DIAGNOSIS — M5136 Other intervertebral disc degeneration, lumbar region: Secondary | ICD-10-CM

## 2016-09-23 DIAGNOSIS — K219 Gastro-esophageal reflux disease without esophagitis: Secondary | ICD-10-CM | POA: Diagnosis not present

## 2016-09-23 DIAGNOSIS — Z87891 Personal history of nicotine dependence: Secondary | ICD-10-CM | POA: Diagnosis not present

## 2016-09-23 DIAGNOSIS — Z7982 Long term (current) use of aspirin: Secondary | ICD-10-CM | POA: Insufficient documentation

## 2016-09-23 DIAGNOSIS — K589 Irritable bowel syndrome without diarrhea: Secondary | ICD-10-CM | POA: Insufficient documentation

## 2016-09-23 DIAGNOSIS — M5442 Lumbago with sciatica, left side: Secondary | ICD-10-CM

## 2016-09-23 DIAGNOSIS — M810 Age-related osteoporosis without current pathological fracture: Secondary | ICD-10-CM | POA: Diagnosis not present

## 2016-09-23 DIAGNOSIS — F419 Anxiety disorder, unspecified: Secondary | ICD-10-CM | POA: Diagnosis not present

## 2016-09-23 DIAGNOSIS — M4696 Unspecified inflammatory spondylopathy, lumbar region: Secondary | ICD-10-CM

## 2016-09-23 DIAGNOSIS — M549 Dorsalgia, unspecified: Secondary | ICD-10-CM | POA: Diagnosis present

## 2016-09-23 DIAGNOSIS — M25559 Pain in unspecified hip: Secondary | ICD-10-CM | POA: Insufficient documentation

## 2016-09-23 MED ORDER — TRAMADOL HCL 50 MG PO TABS: 50.0000 mg | ORAL_TABLET | Freq: Two times a day (BID) | ORAL | 5 refills | Status: DC | PRN

## 2016-09-23 MED ORDER — TRAMADOL HCL 50 MG PO TABS: 50.0000 mg | ORAL_TABLET | Freq: Two times a day (BID) | ORAL | 1 refills | Status: DC

## 2016-09-23 NOTE — Progress Notes (Signed)
Safety precautions to be maintained throughout the outpatient stay will include: orient to surroundings, keep bed in low position, maintain call bell within reach at all times, provide assistance with transfer out of bed and ambulation.  

## 2016-09-23 NOTE — Patient Instructions (Signed)
GENERAL RISKS AND COMPLICATIONS  What are the risk, side effects and possible complications? Generally speaking, most procedures are safe.  However, with any procedure there are risks, side effects, and the possibility of complications.  The risks and complications are dependent upon the sites that are lesioned, or the type of nerve block to be performed.  The closer the procedure is to the spine, the more serious the risks are.  Great care is taken when placing the radio frequency needles, block needles or lesioning probes, but sometimes complications can occur. 1. Infection: Any time there is an injection through the skin, there is a risk of infection.  This is why sterile conditions are used for these blocks.  There are four possible types of infection. 1. Localized skin infection. 2. Central Nervous System Infection-This can be in the form of Meningitis, which can be deadly. 3. Epidural Infections-This can be in the form of an epidural abscess, which can cause pressure inside of the spine, causing compression of the spinal cord with subsequent paralysis. This would require an emergency surgery to decompress, and there are no guarantees that the patient would recover from the paralysis. 4. Discitis-This is an infection of the intervertebral discs.  It occurs in about 1% of discography procedures.  It is difficult to treat and it may lead to surgery.        2. Pain: the needles have to go through skin and soft tissues, will cause soreness.       3. Damage to internal structures:  The nerves to be lesioned may be near blood vessels or    other nerves which can be potentially damaged.       4. Bleeding: Bleeding is more common if the patient is taking blood thinners such as  aspirin, Coumadin, Ticiid, Plavix, etc., or if he/she have some genetic predisposition  such as hemophilia. Bleeding into the spinal canal can cause compression of the spinal  cord with subsequent paralysis.  This would require an  emergency surgery to  decompress and there are no guarantees that the patient would recover from the  paralysis.       5. Pneumothorax:  Puncturing of a lung is a possibility, every time a needle is introduced in  the area of the chest or upper back.  Pneumothorax refers to free air around the  collapsed lung(s), inside of the thoracic cavity (chest cavity).  Another two possible  complications related to a similar event would include: Hemothorax and Chylothorax.   These are variations of the Pneumothorax, where instead of air around the collapsed  lung(s), you may have blood or chyle, respectively.       6. Spinal headaches: They may occur with any procedures in the area of the spine.       7. Persistent CSF (Cerebro-Spinal Fluid) leakage: This is a rare problem, but may occur  with prolonged intrathecal or epidural catheters either due to the formation of a fistulous  track or a dural tear.       8. Nerve damage: By working so close to the spinal cord, there is always a possibility of  nerve damage, which could be as serious as a permanent spinal cord injury with  paralysis.       9. Death:  Although rare, severe deadly allergic reactions known as "Anaphylactic  reaction" can occur to any of the medications used.      10. Worsening of the symptoms:  We can always make thing worse.    What are the chances of something like this happening? Chances of any of this occuring are extremely low.  By statistics, you have more of a chance of getting killed in a motor vehicle accident: while driving to the hospital than any of the above occurring .  Nevertheless, you should be aware that they are possibilities.  In general, it is similar to taking a shower.  Everybody knows that you can slip, hit your head and get killed.  Does that mean that you should not shower again?  Nevertheless always keep in mind that statistics do not mean anything if you happen to be on the wrong side of them.  Even if a procedure has a 1  (one) in a 1,000,000 (million) chance of going wrong, it you happen to be that one..Also, keep in mind that by statistics, you have more of a chance of having something go wrong when taking medications.  Who should not have this procedure? If you are on a blood thinning medication (e.g. Coumadin, Plavix, see list of "Blood Thinners"), or if you have an active infection going on, you should not have the procedure.  If you are taking any blood thinners, please inform your physician.  How should I prepare for this procedure?  Do not eat or drink anything at least six hours prior to the procedure.  Bring a driver with you .  It cannot be a taxi.  Come accompanied by an adult that can drive you back, and that is strong enough to help you if your legs get weak or numb from the local anesthetic.  Take all of your medicines the morning of the procedure with just enough water to swallow them.  If you have diabetes, make sure that you are scheduled to have your procedure done first thing in the morning, whenever possible.  If you have diabetes, take only half of your insulin dose and notify our nurse that you have done so as soon as you arrive at the clinic.  If you are diabetic, but only take blood sugar pills (oral hypoglycemic), then do not take them on the morning of your procedure.  You may take them after you have had the procedure.  Do not take aspirin or any aspirin-containing medications, at least eleven (11) days prior to the procedure.  They may prolong bleeding.  Wear loose fitting clothing that may be easy to take off and that you would not mind if it got stained with Betadine or blood.  Do not wear any jewelry or perfume  Remove any nail coloring.  It will interfere with some of our monitoring equipment.  NOTE: Remember that this is not meant to be interpreted as a complete list of all possible complications.  Unforeseen problems may occur.  BLOOD THINNERS The following drugs  contain aspirin or other products, which can cause increased bleeding during surgery and should not be taken for 2 weeks prior to and 1 week after surgery.  If you should need take something for relief of minor pain, you may take acetaminophen which is found in Tylenol,m Datril, Anacin-3 and Panadol. It is not blood thinner. The products listed below are.  Do not take any of the products listed below in addition to any listed on your instruction sheet.  A.P.C or A.P.C with Codeine Codeine Phosphate Capsules #3 Ibuprofen Ridaura  ABC compound Congesprin Imuran rimadil  Advil Cope Indocin Robaxisal  Alka-Seltzer Effervescent Pain Reliever and Antacid Coricidin or Coricidin-D  Indomethacin Rufen    Alka-Seltzer plus Cold Medicine Cosprin Ketoprofen S-A-C Tablets  Anacin Analgesic Tablets or Capsules Coumadin Korlgesic Salflex  Anacin Extra Strength Analgesic tablets or capsules CP-2 Tablets Lanoril Salicylate  Anaprox Cuprimine Capsules Levenox Salocol  Anexsia-D Dalteparin Magan Salsalate  Anodynos Darvon compound Magnesium Salicylate Sine-off  Ansaid Dasin Capsules Magsal Sodium Salicylate  Anturane Depen Capsules Marnal Soma  APF Arthritis pain formula Dewitt's Pills Measurin Stanback  Argesic Dia-Gesic Meclofenamic Sulfinpyrazone  Arthritis Bayer Timed Release Aspirin Diclofenac Meclomen Sulindac  Arthritis pain formula Anacin Dicumarol Medipren Supac  Analgesic (Safety coated) Arthralgen Diffunasal Mefanamic Suprofen  Arthritis Strength Bufferin Dihydrocodeine Mepro Compound Suprol  Arthropan liquid Dopirydamole Methcarbomol with Aspirin Synalgos  ASA tablets/Enseals Disalcid Micrainin Tagament  Ascriptin Doan's Midol Talwin  Ascriptin A/D Dolene Mobidin Tanderil  Ascriptin Extra Strength Dolobid Moblgesic Ticlid  Ascriptin with Codeine Doloprin or Doloprin with Codeine Momentum Tolectin  Asperbuf Duoprin Mono-gesic Trendar  Aspergum Duradyne Motrin or Motrin IB Triminicin  Aspirin  plain, buffered or enteric coated Durasal Myochrisine Trigesic  Aspirin Suppositories Easprin Nalfon Trillsate  Aspirin with Codeine Ecotrin Regular or Extra Strength Naprosyn Uracel  Atromid-S Efficin Naproxen Ursinus  Auranofin Capsules Elmiron Neocylate Vanquish  Axotal Emagrin Norgesic Verin  Azathioprine Empirin or Empirin with Codeine Normiflo Vitamin E  Azolid Emprazil Nuprin Voltaren  Bayer Aspirin plain, buffered or children's or timed BC Tablets or powders Encaprin Orgaran Warfarin Sodium  Buff-a-Comp Enoxaparin Orudis Zorpin  Buff-a-Comp with Codeine Equegesic Os-Cal-Gesic   Buffaprin Excedrin plain, buffered or Extra Strength Oxalid   Bufferin Arthritis Strength Feldene Oxphenbutazone   Bufferin plain or Extra Strength Feldene Capsules Oxycodone with Aspirin   Bufferin with Codeine Fenoprofen Fenoprofen Pabalate or Pabalate-SF   Buffets II Flogesic Panagesic   Buffinol plain or Extra Strength Florinal or Florinal with Codeine Panwarfarin   Buf-Tabs Flurbiprofen Penicillamine   Butalbital Compound Four-way cold tablets Penicillin   Butazolidin Fragmin Pepto-Bismol   Carbenicillin Geminisyn Percodan   Carna Arthritis Reliever Geopen Persantine   Carprofen Gold's salt Persistin   Chloramphenicol Goody's Phenylbutazone   Chloromycetin Haltrain Piroxlcam   Clmetidine heparin Plaquenil   Cllnoril Hyco-pap Ponstel   Clofibrate Hydroxy chloroquine Propoxyphen         Before stopping any of these medications, be sure to consult the physician who ordered them.  Some, such as Coumadin (Warfarin) are ordered to prevent or treat serious conditions such as "deep thrombosis", "pumonary embolisms", and other heart problems.  The amount of time that you may need off of the medication may also vary with the medication and the reason for which you were taking it.  If you are taking any of these medications, please make sure you notify your pain physician before you undergo any  procedures.         Epidural Steroid Injection An epidural steroid injection is a shot of steroid medicine and numbing medicine that is given into the space between the spinal cord and the bones in your back (epidural space). The shot helps relieve pain caused by an irritated or swollen nerve root. The amount of pain relief you get from the injection depends on what is causing the nerve to be swollen and irritated, and how long your pain lasts. You are more likely to benefit from this injection if your pain is strong and comes on suddenly rather than if you have had pain for a long time. Tell a health care provider about:  Any allergies you have.  All medicines you are taking, including vitamins, herbs,   eye drops, creams, and over-the-counter medicines.  Any problems you or family members have had with anesthetic medicines.  Any blood disorders you have.  Any surgeries you have had.  Any medical conditions you have.  Whether you are pregnant or may be pregnant. What are the risks? Generally, this is a safe procedure. However, problems may occur, including:  Headache.  Bleeding.  Infection.  Allergic reaction to medicines.  Damage to your nerves.  What happens before the procedure? Staying hydrated Follow instructions from your health care provider about hydration, which may include:  Up to 2 hours before the procedure - you may continue to drink clear liquids, such as water, clear fruit juice, black coffee, and plain tea.  Eating and drinking restrictions Follow instructions from your health care provider about eating and drinking, which may include:  8 hours before the procedure - stop eating heavy meals or foods such as meat, fried foods, or fatty foods.  6 hours before the procedure - stop eating light meals or foods, such as toast or cereal.  6 hours before the procedure - stop drinking milk or drinks that contain milk.  2 hours before the procedure - stop  drinking clear liquids.  Medicine  You may be given medicines to lower anxiety.  Ask your health care provider about: ? Changing or stopping your regular medicines. This is especially important if you are taking diabetes medicines or blood thinners. ? Taking medicines such as aspirin and ibuprofen. These medicines can thin your blood. Do not take these medicines before your procedure if your health care provider instructs you not to. General instructions  Plan to have someone take you home from the hospital or clinic. What happens during the procedure?  You may receive a medicine to help you relax (sedative).  You will be asked to lie on your abdomen.  The injection site will be cleaned.  A numbing medicine (local anesthetic) will be used to numb the injection site.  A needle will be inserted through your skin into the epidural space. You may feel some discomfort when this happens. An X-ray machine will be used to make sure the needle is put as close as possible to the affected nerve.  A steroid medicine and a local anesthetic will be injected into the epidural space.  The needle will be removed.  A bandage (dressing) will be put over the injection site. What happens after the procedure?  Your blood pressure, heart rate, breathing rate, and blood oxygen level will be monitored until the medicines you were given have worn off.  Your arm or leg may feel weak or numb for a few hours.  The injection site may feel sore.  Do not drive for 24 hours if you received a sedative. This information is not intended to replace advice given to you by your health care provider. Make sure you discuss any questions you have with your health care provider. Document Released: 12/27/2007 Document Revised: 03/02/2016 Document Reviewed: 01/05/2016 [employer] Interactive Patient Education  2017 [employer]  

## 2016-09-24 NOTE — Progress Notes (Signed)
Subjective:  Patient ID: Ashley Murillo, female    DOB: Jul 07, 1946  Age: 70 y.o. MRN: [id number]  CC: Back Pain (upper-mid); Hip Pain (left); and Knee Pain (left)      PROCEDURE:None  HPI Ashley Murillo presents for a new patient evaluation. She presents with a long-standing history of low back pain and left leg pain. She also has lumbar and thoracic pain. She reports having had previous epidural steroid injections by Dr. Chaney Mallinghassnis. She reports good relief with the epidurals in the past giving her 60-75% improvement for approximately 2 months. This seems to get her pain under better control and allows her to continue with her baseline tramadol medication that she uses intermittently when her pain flares. The pain is described as a VAS of asked to worst 10 and averaging an 8. 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 It's worse with stooping walking uphill standing kneeling lifting and better with sleep warm shower TENS application or medication management. She takes tramadol up to twice a day when it severe and this seems to help significantly. The pain radiates into the left posterior and lateral leg with occasional give way weakness but no problems with bowel or bladder dysfunction. The pain is gradually been getting worse and it's been approximately a year or so since she has had her last epidural. She's had a previous MRI showing evidence of an L4-5 disc bulge and lumbar x-rays revealing facet arthropathy.  History Ashley Murillo has a past medical history of Allergy; Anxiety; Arthritis; Back muscle spasm; Chronic back pain; Diverticulosis; GERD (gastroesophageal reflux disease); IBS (irritable bowel syndrome); Insomnia; Low blood monocyte count; Osteoporosis; and Stroke (HCC) (08/2016).   She has a past surgical history that includes Appendectomy; uterine tumors; Cholecystectomy; and knee cast.   Her family history includes Cancer in her father;  GI problems in her mother.She reports that she has quit smoking. She has never used smokeless tobacco. She reports that she does not drink alcohol or use drugs.  Results for orders placed in visit on 01/10/05  [facility] W/O Cm   Narrative * PRIOR REPORT IMPORTED FROM AN EXTERNAL SYSTEM *   PRIOR REPORT IMPORTED FROM THE [facility] WORKFLOW SYSTEM   REASON FOR EXAM:  back pn w/RT leg pn and numbness  COMMENTS:   PROCEDURE:     MR  - MR LUMBAR SPINE WO CONTRAST  - Jan 10 2005  3:36PM   RESULT:     Multiplanar/multisequence imaging of the lumbar spine is  obtained. No evidence of disc protrusion or spinal stenosis. L4-L5 mild  annular bulge is present.   IMPRESSION:   1)Mild L4-L5 annular bulge. No evidence of significant disc protrusion or  spinal stenosis.        No results found for: TOXASSSELUR  Outpatient Medications Prior to Visit  Medication Sig Dispense Refill  . aspirin EC 81 MG EC tablet Take 1 tablet (81 mg total) by mouth daily. 120 tablet 0  . atorvastatin (LIPITOR) 40 MG tablet Take 1 tablet (40 mg total) by mouth daily at 6 PM. 30 tablet 0  . dicyclomine (BENTYL) 20 MG tablet TAKE 1 TABLET BY MOUTH 3 TIMES A DAY 90 tablet 6  . diphenhydrAMINE (BENADRYL) 25 mg capsule Take 25 mg by mouth at bedtime as needed for allergies.     . DULoxetine (CYMBALTA) 60 MG capsule Take 60 mg by mouth daily.  5  . Multiple Vitamin (MULTIVITAMIN WITH MINERALS)  TABS tablet Take 1 tablet by mouth daily.    . ondansetron (ZOFRAN) 8 MG tablet Take 8 mg by mouth every 8 (eight) hours as needed for nausea or vomiting.     . pantoprazole (PROTONIX) 40 MG tablet Take 1 tablet (40 mg total) by mouth daily. 30 tablet 0  . traZODone (DESYREL) 100 MG tablet Take 100 mg by mouth at bedtime as needed for sleep.   11  . traMADol (ULTRAM) 50 MG tablet Take 50 mg by mouth 2 (two) times daily as needed.  5  . b complex vitamins tablet Take 1 tablet by mouth daily.    Marland Kitchen BESIVANCE 0.6 % SUSP INSTILL 1  DROP both eyes at bedtime  1  . CALCIUM PO Take 1 tablet by mouth daily.    . Cholecalciferol (VITAMIN D PO) Take 1 tablet by mouth daily.    Marland Kitchen HYDROcodone-acetaminophen (NORCO/VICODIN) 5-325 MG per tablet Take 1-2 tablets every 6 hours as needed for severe pain (Patient not taking: Reported on 09/23/2016) 12 tablet 0  . sucralfate (CARAFATE) 1 GM/10ML suspension Take 10 mLs (1 g total) by mouth 4 (four) times daily -  with meals and at bedtime. (Patient not taking: Reported on 09/23/2016) 420 mL 0   No facility-administered medications prior to visit.      --------------------------------------------------------------------------------------------------------------------- US Carotid Bilateral  Result Date: 08/13/2016 CLINICAL DATA:  TIA and syncope. EXAM: BILATERAL CAROTID DUPLEX ULTRASOUND TECHNIQUE: Wallace Cullens scale imaging, color Doppler and duplex ultrasound were performed of bilateral carotid and vertebral arteries in the neck. COMPARISON:  None. FINDINGS: Criteria: Quantification of carotid stenosis is based on velocity parameters that correlate the residual internal carotid diameter with NASCET-based stenosis levels, using the diameter of the distal internal carotid lumen as the denominator for stenosis measurement. The following velocity measurements were obtained: RIGHT ICA:  86/25 cm/sec CCA:  78/15 cm/sec SYSTOLIC ICA/CCA RATIO:  1.1 DIASTOLIC ICA/CCA RATIO:  1.7 ECA:  71 cm/sec LEFT ICA:  80/31 cm/sec CCA:  69/17 cm/sec SYSTOLIC ICA/CCA RATIO:  1.2 DIASTOLIC ICA/CCA RATIO:  1.8 ECA:  68 cm/sec RIGHT CAROTID ARTERY: Mild common carotid and carotid bulb intimal thickening without evidence of focal plaque. No evidence of carotid stenosis. RIGHT VERTEBRAL ARTERY: Antegrade flow with normal waveform and velocity. LEFT CAROTID ARTERY: Stable mild intimal thickening of common carotid artery and carotid bulb without focal plaque or evidence of carotid stenosis. LEFT VERTEBRAL ARTERY: Antegrade flow with  normal waveform and velocity. IMPRESSION: Unremarkable carotid duplex ultrasound demonstrating no evidence of carotid stenosis in the neck bilaterally. Mild intimal thickening present without focal plaque or velocity elevation. Electronically Signed   By: Irish Lack M.D.   On: 08/13/2016 11:02       ---------------------------------------------------------------------------------------------------------------------- Past Medical History:  Diagnosis Date  . Allergy   . Anxiety   . Arthritis   . Back muscle spasm   . Chronic back pain   . Diverticulosis   . GERD (gastroesophageal reflux disease)   . IBS (irritable bowel syndrome)   . Insomnia   . Low blood monocyte count   . Osteoporosis   . Stroke [facility]) 08/2016    Past Surgical History:  Procedure Laterality Date  . APPENDECTOMY    . CHOLECYSTECTOMY    . knee cast     left 2015  . uterine tumors      Family History  Problem Relation Age of Onset  . GI problems Mother   . Cancer Father     Social History  Substance Use Topics  . Smoking status: Former Games developermoker  . Smokeless tobacco: Never Used  . Alcohol use No    ---------------------------------------------------------------------------------------------------------------------- Social History   Social History  . Marital status: Married    Spouse name: N/A  . Number of children: N/A  . Years of education: N/A   Social History Main Topics  . Smoking status: Former Games developermoker  . Smokeless tobacco: Never Used  . Alcohol use No  . Drug use: No  . Sexual activity: Not on file   Other Topics Concern  . Not on file   Social History Narrative   Lives at home with husband. Independent and considers herself active    Scheduled Meds: Continuous Infusions: PRN Meds:.   BP (!) 139/59   Pulse 75   Temp 98.2 F (36.8 C)   Resp 16   Ht 5' (1.524 m)   Wt 130 lb (59 kg)   SpO2 99%   BMI 25.39 kg/m    BP Readings from Last 3 Encounters:  09/23/16 (!)  139/59  08/13/16 140/75  12/22/14 137/68     Wt Readings from Last 3 Encounters:  09/23/16 130 lb (59 kg)  08/12/16 130 lb (59 kg)     ----------------------------------------------------------------------------------------------------------------------  ROS Review of Systems  Cardiac: Daily aspirin no angina no shortness of breath or orthopnea Pulmonary no wheezing Neurologic: Status post stroke with no residual weakness and as above Psychologic: No depression or history of suicidal ideation or drug abuse GI: Reflux: Rheumatologic low back pain with osteoarthritis  Objective:  BP (!) 139/59   Pulse 75   Temp 98.2 F (36.8 C)   Resp 16   Ht 5' (1.524 m)   Wt 130 lb (59 kg)   SpO2 99%   BMI 25.39 kg/m   Physical Exam Pupils are equally round reactive to light extraocular muscles are intact Patient is good historian Heart is regular rate and rhythm without murmur Lungs are clear to auscultation Inspection low back reveals some paraspinous muscle tenderness She has a positive straight leg raise on the left side at 30-40. Her strength appears to be intact both proximal and distal with good muscle tone and bulk. Sensation is grossly intact she also has pain on extension with left lateral rotation in the standing position this is less apparent with right lateral rotation.     Assessment & Plan:   Ashley Murillo was seen today for back pain, hip pain and knee pain.  Diagnoses and all orders for this visit:  DDD (degenerative disc disease), lumbar -     Lumbar Epidural Injection; Future  Facet arthritis of lumbar region [facility](HCC)  Chronic left-sided low back pain with left-sided sciatica -     Lumbar Epidural Injection; Future -     ToxASSURE Select 13 (MW), Urine  Other orders -     traMADol (ULTRAM) 50 MG tablet; Take 1 tablet (50 mg total) by mouth 2 (two) times daily. -     traMADol (ULTRAM) 50 MG tablet; Take 1 tablet (50 mg total) by mouth 2 (two) times daily as  needed.     ----------------------------------------------------------------------------------------------------------------------  Problem List Items Addressed This Visit    None    Visit Diagnoses    DDD (degenerative disc disease), lumbar    -  Primary   Relevant Medications   traMADol (ULTRAM) 50 MG tablet   traMADol (ULTRAM) 50 MG tablet   Other Relevant Orders   Lumbar Epidural Injection   Facet arthritis of lumbar region (  HCC)       Relevant Medications   traMADol (ULTRAM) 50 MG tablet   traMADol (ULTRAM) 50 MG tablet   Chronic left-sided low back pain with left-sided sciatica       Relevant Medications   traMADol (ULTRAM) 50 MG tablet   traMADol (ULTRAM) 50 MG tablet   Other Relevant Orders   Lumbar Epidural Injection   ToxASSURE Select 13 (MW), Urine      ----------------------------------------------------------------------------------------------------------------------  1. DDD (degenerative disc disease), lumbar We will plan on an epidural steroid at the next available date. We've gone over the risks and benefits of the procedure and she feels comfortable with this. - Lumbar Epidural Injection; Future  2. Facet arthritis of lumbar region [facility]) Based on her lumbar x-rays and the pain she experiences with extension and left lateral rotation she may be a candidate for diagnostic lumbar facet block.  3. Chronic left-sided low back pain with left-sided sciatica We will refill her prescription today for Ultram 50 tablets 1 twice a day when necessary with return to clinic in the next 1-2 weeks for her first epidural injection. - Lumbar Epidural Injection; Future - ToxASSURE Select 13 (MW), Urine    ----------------------------------------------------------------------------------------------------------------------  I have changed Ashley Murillo's traMADol. I am also having her start on traMADol. Additionally, I am having her maintain her DULoxetine, traZODone,  diphenhydrAMINE, ondansetron, b complex vitamins, multivitamin with minerals, CALCIUM PO, Cholecalciferol (VITAMIN D PO), pantoprazole, sucralfate, HYDROcodone-acetaminophen, BESIVANCE, aspirin, atorvastatin, and dicyclomine.   Meds ordered this encounter  Medications  . traMADol (ULTRAM) 50 MG tablet    Sig: Take 1 tablet (50 mg total) by mouth 2 (two) times daily.    Dispense:  60 tablet    Refill:  1  . traMADol (ULTRAM) 50 MG tablet    Sig: Take 1 tablet (50 mg total) by mouth 2 (two) times daily as needed.    Dispense:  30 tablet    Refill:  5       Follow-up: Return in about 2 weeks (around 10/07/2016) for evaluation, med refill.    Yevette Edwards, MD  The [place] practitioner database for opioid medications on this patient has been reviewed by me and my staff   Greater than 50% of the total encounter time was spent in counseling and / or coordination of care.     This dictation was performed utilizing Conservation officer, historic buildings.  Please excuse any unintentional or mistaken typographical errors as a result.

## 2016-10-04 LAB — TOXASSURE SELECT 13 (MW), URINE

## 2016-10-05 ENCOUNTER — Ambulatory Visit
Admission: RE | Admit: 2016-10-05 | Discharge: 2016-10-05 | Disposition: A | Discharge status: Home / Self Care | Payer: Medicare Other | Source: Ambulatory Visit | Attending: Anesthesiology | Admitting: Anesthesiology

## 2016-10-05 ENCOUNTER — Ambulatory Visit (HOSPITAL_BASED_OUTPATIENT_CLINIC_OR_DEPARTMENT_OTHER): Payer: Medicare Other | Admitting: Anesthesiology

## 2016-10-05 ENCOUNTER — Other Ambulatory Visit: Payer: Self-pay | Admitting: Anesthesiology

## 2016-10-05 ENCOUNTER — Encounter: Payer: Self-pay | Admitting: Anesthesiology

## 2016-10-05 DIAGNOSIS — R52 Pain, unspecified: Secondary | ICD-10-CM

## 2016-10-05 DIAGNOSIS — M5442 Lumbago with sciatica, left side: Secondary | ICD-10-CM | POA: Insufficient documentation

## 2016-10-05 DIAGNOSIS — F419 Anxiety disorder, unspecified: Secondary | ICD-10-CM | POA: Insufficient documentation

## 2016-10-05 DIAGNOSIS — M810 Age-related osteoporosis without current pathological fracture: Secondary | ICD-10-CM | POA: Insufficient documentation

## 2016-10-05 DIAGNOSIS — M5136 Other intervertebral disc degeneration, lumbar region: Secondary | ICD-10-CM | POA: Insufficient documentation

## 2016-10-05 DIAGNOSIS — K219 Gastro-esophageal reflux disease without esophagitis: Secondary | ICD-10-CM | POA: Insufficient documentation

## 2016-10-05 DIAGNOSIS — G8929 Other chronic pain: Secondary | ICD-10-CM | POA: Insufficient documentation

## 2016-10-05 DIAGNOSIS — Z8673 Personal history of transient ischemic attack (TIA), and cerebral infarction without residual deficits: Secondary | ICD-10-CM | POA: Insufficient documentation

## 2016-10-05 DIAGNOSIS — G4700 Insomnia, unspecified: Secondary | ICD-10-CM | POA: Insufficient documentation

## 2016-10-05 DIAGNOSIS — Z87891 Personal history of nicotine dependence: Secondary | ICD-10-CM | POA: Insufficient documentation

## 2016-10-05 DIAGNOSIS — M545 Low back pain: Secondary | ICD-10-CM | POA: Diagnosis present

## 2016-10-05 DIAGNOSIS — Z7982 Long term (current) use of aspirin: Secondary | ICD-10-CM | POA: Insufficient documentation

## 2016-10-05 DIAGNOSIS — K589 Irritable bowel syndrome without diarrhea: Secondary | ICD-10-CM | POA: Insufficient documentation

## 2016-10-05 DIAGNOSIS — Z79891 Long term (current) use of opiate analgesic: Secondary | ICD-10-CM | POA: Insufficient documentation

## 2016-10-05 DIAGNOSIS — Z79899 Other long term (current) drug therapy: Secondary | ICD-10-CM | POA: Insufficient documentation

## 2016-10-05 MED ORDER — MIDAZOLAM HCL 5 MG/5ML IJ SOLN
INTRAMUSCULAR | Status: AC
  Administered 2016-10-05: 2 mg
  Filled 2016-10-05: qty 5

## 2016-10-05 MED ORDER — ROPIVACAINE HCL 2 MG/ML IJ SOLN
INTRAMUSCULAR | Status: AC
Start: 2016-10-05 — End: 2016-10-05
  Administered 2016-10-05: 14:00:00
  Filled 2016-10-05: qty 10

## 2016-10-05 MED ORDER — ROPIVACAINE HCL 2 MG/ML IJ SOLN: 10.0000 mL | Freq: Once | INTRAMUSCULAR | Status: DC

## 2016-10-05 MED ORDER — IOPAMIDOL (ISOVUE-M 200) INJECTION 41%
INTRAMUSCULAR | Status: AC
  Administered 2016-10-05: 14:00:00
  Filled 2016-10-05: qty 10

## 2016-10-05 MED ORDER — IOPAMIDOL (ISOVUE-M 200) INJECTION 41%: 20.0000 mL | Freq: Once | INTRAMUSCULAR | Status: DC | PRN

## 2016-10-05 MED ORDER — SODIUM CHLORIDE 0.9 % IJ SOLN
INTRAMUSCULAR | Status: AC
  Administered 2016-10-05: 14:00:00
  Filled 2016-10-05: qty 10

## 2016-10-05 MED ORDER — MIDAZOLAM HCL 5 MG/5ML IJ SOLN: 5.0000 mg | Freq: Once | INTRAMUSCULAR | Status: DC

## 2016-10-05 MED ORDER — LIDOCAINE HCL (PF) 1 % IJ SOLN
INTRAMUSCULAR | Status: AC
  Administered 2016-10-05: 14:00:00
  Filled 2016-10-05: qty 5

## 2016-10-05 MED ORDER — LACTATED RINGERS IV SOLN: 1000.0000 mL | INTRAVENOUS | Status: DC

## 2016-10-05 MED ORDER — SODIUM CHLORIDE 0.9% FLUSH: 10.0000 mL | Freq: Once | INTRAVENOUS | Status: DC

## 2016-10-05 MED ORDER — TRIAMCINOLONE ACETONIDE 40 MG/ML IJ SUSP: 40.0000 mg | Freq: Once | INTRAMUSCULAR | Status: DC

## 2016-10-05 MED ORDER — LIDOCAINE HCL (PF) 1 % IJ SOLN: 5.0000 mL | Freq: Once | INTRAMUSCULAR | Status: DC

## 2016-10-05 MED ORDER — TRIAMCINOLONE ACETONIDE 40 MG/ML IJ SUSP
INTRAMUSCULAR | Status: AC
  Administered 2016-10-05: 14:00:00
  Filled 2016-10-05: qty 1

## 2016-10-05 NOTE — Patient Instructions (Signed)
GENERAL RISKS AND COMPLICATIONS  What are the risk, side effects and possible complications? Generally speaking, most procedures are safe.  However, with any procedure there are risks, side effects, and the possibility of complications.  The risks and complications are dependent upon the sites that are lesioned, or the type of nerve block to be performed.  The closer the procedure is to the spine, the more serious the risks are.  Great care is taken when placing the radio frequency needles, block needles or lesioning probes, but sometimes complications can occur. 1. Infection: Any time there is an injection through the skin, there is a risk of infection.  This is why sterile conditions are used for these blocks.  There are four possible types of infection. 1. Localized skin infection. 2. Central Nervous System Infection-This can be in the form of Meningitis, which can be deadly. 3. Epidural Infections-This can be in the form of an epidural abscess, which can cause pressure inside of the spine, causing compression of the spinal cord with subsequent paralysis. This would require an emergency surgery to decompress, and there are no guarantees that the patient would recover from the paralysis. 4. Discitis-This is an infection of the intervertebral discs.  It occurs in about 1% of discography procedures.  It is difficult to treat and it may lead to surgery.        2. Pain: the needles have to go through skin and soft tissues, will cause soreness.       3. Damage to internal structures:  The nerves to be lesioned may be near blood vessels or    other nerves which can be potentially damaged.       4. Bleeding: Bleeding is more common if the patient is taking blood thinners such as  aspirin, Coumadin, Ticiid, Plavix, etc., or if he/she have some genetic predisposition  such as hemophilia. Bleeding into the spinal canal can cause compression of the spinal  cord with subsequent paralysis.  This would require an  emergency surgery to  decompress and there are no guarantees that the patient would recover from the  paralysis.       5. Pneumothorax:  Puncturing of a lung is a possibility, every time a needle is introduced in  the area of the chest or upper back.  Pneumothorax refers to free air around the  collapsed lung(s), inside of the thoracic cavity (chest cavity).  Another two possible  complications related to a similar event would include: Hemothorax and Chylothorax.   These are variations of the Pneumothorax, where instead of air around the collapsed  lung(s), you may have blood or chyle, respectively.       6. Spinal headaches: They may occur with any procedures in the area of the spine.       7. Persistent CSF (Cerebro-Spinal Fluid) leakage: This is a rare problem, but may occur  with prolonged intrathecal or epidural catheters either due to the formation of a fistulous  track or a dural tear.       8. Nerve damage: By working so close to the spinal cord, there is always a possibility of  nerve damage, which could be as serious as a permanent spinal cord injury with  paralysis.       9. Death:  Although rare, severe deadly allergic reactions known as "Anaphylactic  reaction" can occur to any of the medications used.      10. Worsening of the symptoms:  We can always make thing worse.    What are the chances of something like this happening? Chances of any of this occuring are extremely low.  By statistics, you have more of a chance of getting killed in a motor vehicle accident: while driving to the hospital than any of the above occurring .  Nevertheless, you should be aware that they are possibilities.  In general, it is similar to taking a shower.  Everybody knows that you can slip, hit your head and get killed.  Does that mean that you should not shower again?  Nevertheless always keep in mind that statistics do not mean anything if you happen to be on the wrong side of them.  Even if a procedure has a 1  (one) in a 1,000,000 (million) chance of going wrong, it you happen to be that one..Also, keep in mind that by statistics, you have more of a chance of having something go wrong when taking medications.  Who should not have this procedure? If you are on a blood thinning medication (e.g. Coumadin, Plavix, see list of "Blood Thinners"), or if you have an active infection going on, you should not have the procedure.  If you are taking any blood thinners, please inform your physician.  How should I prepare for this procedure?  Do not eat or drink anything at least six hours prior to the procedure.  Bring a driver with you .  It cannot be a taxi.  Come accompanied by an adult that can drive you back, and that is strong enough to help you if your legs get weak or numb from the local anesthetic.  Take all of your medicines the morning of the procedure with just enough water to swallow them.  If you have diabetes, make sure that you are scheduled to have your procedure done first thing in the morning, whenever possible.  If you have diabetes, take only half of your insulin dose and notify our nurse that you have done so as soon as you arrive at the clinic.  If you are diabetic, but only take blood sugar pills (oral hypoglycemic), then do not take them on the morning of your procedure.  You may take them after you have had the procedure.  Do not take aspirin or any aspirin-containing medications, at least eleven (11) days prior to the procedure.  They may prolong bleeding.  Wear loose fitting clothing that may be easy to take off and that you would not mind if it got stained with Betadine or blood.  Do not wear any jewelry or perfume  Remove any nail coloring.  It will interfere with some of our monitoring equipment.  NOTE: Remember that this is not meant to be interpreted as a complete list of all possible complications.  Unforeseen problems may occur.  BLOOD THINNERS The following drugs  contain aspirin or other products, which can cause increased bleeding during surgery and should not be taken for 2 weeks prior to and 1 week after surgery.  If you should need take something for relief of minor pain, you may take acetaminophen which is found in Tylenol,m Datril, Anacin-3 and Panadol. It is not blood thinner. The products listed below are.  Do not take any of the products listed below in addition to any listed on your instruction sheet.  A.P.C or A.P.C with Codeine Codeine Phosphate Capsules #3 Ibuprofen Ridaura  ABC compound Congesprin Imuran rimadil  Advil Cope Indocin Robaxisal  Alka-Seltzer Effervescent Pain Reliever and Antacid Coricidin or Coricidin-D  Indomethacin Rufen    Alka-Seltzer plus Cold Medicine Cosprin Ketoprofen S-A-C Tablets  Anacin Analgesic Tablets or Capsules Coumadin Korlgesic Salflex  Anacin Extra Strength Analgesic tablets or capsules CP-2 Tablets Lanoril Salicylate  Anaprox Cuprimine Capsules Levenox Salocol  Anexsia-D Dalteparin Magan Salsalate  Anodynos Darvon compound Magnesium Salicylate Sine-off  Ansaid Dasin Capsules Magsal Sodium Salicylate  Anturane Depen Capsules Marnal Soma  APF Arthritis pain formula Dewitt's Pills Measurin Stanback  Argesic Dia-Gesic Meclofenamic Sulfinpyrazone  Arthritis Bayer Timed Release Aspirin Diclofenac Meclomen Sulindac  Arthritis pain formula Anacin Dicumarol Medipren Supac  Analgesic (Safety coated) Arthralgen Diffunasal Mefanamic Suprofen  Arthritis Strength Bufferin Dihydrocodeine Mepro Compound Suprol  Arthropan liquid Dopirydamole Methcarbomol with Aspirin Synalgos  ASA tablets/Enseals Disalcid Micrainin Tagament  Ascriptin Doan's Midol Talwin  Ascriptin A/D Dolene Mobidin Tanderil  Ascriptin Extra Strength Dolobid Moblgesic Ticlid  Ascriptin with Codeine Doloprin or Doloprin with Codeine Momentum Tolectin  Asperbuf Duoprin Mono-gesic Trendar  Aspergum Duradyne Motrin or Motrin IB Triminicin  Aspirin  plain, buffered or enteric coated Durasal Myochrisine Trigesic  Aspirin Suppositories Easprin Nalfon Trillsate  Aspirin with Codeine Ecotrin Regular or Extra Strength Naprosyn Uracel  Atromid-S Efficin Naproxen Ursinus  Auranofin Capsules Elmiron Neocylate Vanquish  Axotal Emagrin Norgesic Verin  Azathioprine Empirin or Empirin with Codeine Normiflo Vitamin E  Azolid Emprazil Nuprin Voltaren  Bayer Aspirin plain, buffered or children's or timed BC Tablets or powders Encaprin Orgaran Warfarin Sodium  Buff-a-Comp Enoxaparin Orudis Zorpin  Buff-a-Comp with Codeine Equegesic Os-Cal-Gesic   Buffaprin Excedrin plain, buffered or Extra Strength Oxalid   Bufferin Arthritis Strength Feldene Oxphenbutazone   Bufferin plain or Extra Strength Feldene Capsules Oxycodone with Aspirin   Bufferin with Codeine Fenoprofen Fenoprofen Pabalate or Pabalate-SF   Buffets II Flogesic Panagesic   Buffinol plain or Extra Strength Florinal or Florinal with Codeine Panwarfarin   Buf-Tabs Flurbiprofen Penicillamine   Butalbital Compound Four-way cold tablets Penicillin   Butazolidin Fragmin Pepto-Bismol   Carbenicillin Geminisyn Percodan   Carna Arthritis Reliever Geopen Persantine   Carprofen Gold's salt Persistin   Chloramphenicol Goody's Phenylbutazone   Chloromycetin Haltrain Piroxlcam   Clmetidine heparin Plaquenil   Cllnoril Hyco-pap Ponstel   Clofibrate Hydroxy chloroquine Propoxyphen         Before stopping any of these medications, be sure to consult the physician who ordered them.  Some, such as Coumadin (Warfarin) are ordered to prevent or treat serious conditions such as "deep thrombosis", "pumonary embolisms", and other heart problems.  The amount of time that you may need off of the medication may also vary with the medication and the reason for which you were taking it.  If you are taking any of these medications, please make sure you notify your pain physician before you undergo any  procedures.         Epidural Steroid Injection An epidural steroid injection is a shot of steroid medicine and numbing medicine that is given into the space between the spinal cord and the bones in your back (epidural space). The shot helps relieve pain caused by an irritated or swollen nerve root. The amount of pain relief you get from the injection depends on what is causing the nerve to be swollen and irritated, and how long your pain lasts. You are more likely to benefit from this injection if your pain is strong and comes on suddenly rather than if you have had pain for a long time. Tell a health care provider about:  Any allergies you have.  All medicines you are taking, including vitamins, herbs,   eye drops, creams, and over-the-counter medicines.  Any problems you or family members have had with anesthetic medicines.  Any blood disorders you have.  Any surgeries you have had.  Any medical conditions you have.  Whether you are pregnant or may be pregnant. What are the risks? Generally, this is a safe procedure. However, problems may occur, including:  Headache.  Bleeding.  Infection.  Allergic reaction to medicines.  Damage to your nerves.  What happens before the procedure? Staying hydrated Follow instructions from your health care provider about hydration, which may include:  Up to 2 hours before the procedure - you may continue to drink clear liquids, such as water, clear fruit juice, black coffee, and plain tea.  Eating and drinking restrictions Follow instructions from your health care provider about eating and drinking, which may include:  8 hours before the procedure - stop eating heavy meals or foods such as meat, fried foods, or fatty foods.  6 hours before the procedure - stop eating light meals or foods, such as toast or cereal.  6 hours before the procedure - stop drinking milk or drinks that contain milk.  2 hours before the procedure - stop  drinking clear liquids.  Medicine  You may be given medicines to lower anxiety.  Ask your health care provider about: ? Changing or stopping your regular medicines. This is especially important if you are taking diabetes medicines or blood thinners. ? Taking medicines such as aspirin and ibuprofen. These medicines can thin your blood. Do not take these medicines before your procedure if your health care provider instructs you not to. General instructions  Plan to have someone take you home from the hospital or clinic. What happens during the procedure?  You may receive a medicine to help you relax (sedative).  You will be asked to lie on your abdomen.  The injection site will be cleaned.  A numbing medicine (local anesthetic) will be used to numb the injection site.  A needle will be inserted through your skin into the epidural space. You may feel some discomfort when this happens. An X-ray machine will be used to make sure the needle is put as close as possible to the affected nerve.  A steroid medicine and a local anesthetic will be injected into the epidural space.  The needle will be removed.  A bandage (dressing) will be put over the injection site. What happens after the procedure?  Your blood pressure, heart rate, breathing rate, and blood oxygen level will be monitored until the medicines you were given have worn off.  Your arm or leg may feel weak or numb for a few hours.  The injection site may feel sore.  Do not drive for 24 hours if you received a sedative. This information is not intended to replace advice given to you by your health care provider. Make sure you discuss any questions you have with your health care provider. Document Released: 12/27/2007 Document Revised: 03/02/2016 Document Reviewed: 01/05/2016 [employer] Interactive Patient Education  2017 [employer] Pain Management Discharge Instructions  General Discharge Instructions :  If you need to  reach your doctor call: Monday-Friday 8:00 am - 4:00 pm at [phone] or toll free [phone].  After clinic hours [phone] to have operator reach doctor.  Bring all of your medication bottles to all your appointments in the pain clinic.  To cancel or reschedule your appointment with Pain Management please remember to call 24 hours in advance to avoid a fee.    Refer to the educational materials which you have been given on: General Risks, I had my Procedure. Discharge Instructions, Post Sedation.  Post Procedure Instructions:  The drugs you were given will stay in your system until tomorrow, so for the next 24 hours you should not drive, make any legal decisions or drink any alcoholic beverages.  You may eat anything you prefer, but it is better to start with liquids then soups and crackers, and gradually work up to solid foods.  Please notify your doctor immediately if you have any unusual bleeding, trouble breathing or pain that is not related to your normal pain.  Depending on the type of procedure that was done, some parts of your body may feel week and/or numb.  This usually clears up by tonight or the next day.  Walk with the use of an assistive device or accompanied by an adult for the 24 hours.  You may use ice on the affected area for the first 24 hours.  Put ice in a Ziploc bag and cover with a towel and place against area 15 minutes on 15 minutes off.  You may switch to heat after 24 hours. 

## 2016-10-05 NOTE — Progress Notes (Addendum)
Subjective:  Patient ID: Ashley Murillo, female    DOB: [birth year]  Age: 71 y.o. MRN: [id number]  CC: Back Pain (low and mid); Hip Pain (left); and Knee Pain (left)   Service Provided on Last Visit: Evaluation (new)  PROCEDURE: LESI #1  HPI Renesmay Wray presents for reevaluation.  The quality characteristic and distribution of her pain are as previously reported. No changes are noted today. She desires to proceed with her first epidural injection.   By history he has a long-standing history of low back pain and left leg pain. She also has lumbar and thoracic pain. She reports having had previous epidural steroid injections by Dr. Chaney Malling. She reports good relief with the epidurals in the past giving her 60-75% improvement for approximately 2 months. This seems to get her pain under better control and allows her to continue with her baseline tramadol medication that she uses intermittently when her pain flares. The pain is described as a VAS of asked to worst 10 and averaging an 8. 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 It's worse with stooping walking uphill standing kneeling lifting and better with sleep warm shower TENS application or medication management. She takes tramadol up to twice a day when it severe and this seems to help significantly. The pain radiates into the left posterior and lateral leg with occasional give way weakness but no problems with bowel or bladder dysfunction. The pain is gradually been getting worse and it's been approximately a year or so since she has had her last epidural. She's had a previous MRI showing evidence of an L4-5 disc bulge and lumbar x-rays revealing facet arthropathy.  History Janina has a past medical history of Allergy; Anxiety; Arthritis; Back muscle spasm; Chronic back pain; Diverticulosis; GERD (gastroesophageal reflux disease); IBS (irritable bowel syndrome); Insomnia; Low blood  monocyte count; Osteoporosis; and Stroke (HCC) (08/2016).   She has a past surgical history that includes Appendectomy; uterine tumors; Cholecystectomy; and knee cast.   Her family history includes Cancer in her father; GI problems in her mother.She reports that she has quit smoking. She has never used smokeless tobacco. She reports that she does not drink alcohol or use drugs.  Results for orders placed in visit on 01/10/05  [facility] W/O Cm   Narrative * PRIOR REPORT IMPORTED FROM AN EXTERNAL SYSTEM *   PRIOR REPORT IMPORTED FROM THE [facility] WORKFLOW SYSTEM   REASON FOR EXAM:  back pn w/RT leg pn and numbness  COMMENTS:   PROCEDURE:     MR  - MR LUMBAR SPINE WO CONTRAST  - Jan 10 2005  3:36PM   RESULT:     Multiplanar/multisequence imaging of the lumbar spine is  obtained. No evidence of disc protrusion or spinal stenosis. L4-L5 mild  annular bulge is present.   IMPRESSION:   1)Mild L4-L5 annular bulge. No evidence of significant disc protrusion or  spinal stenosis.        Tox[facility] Select 13  Date Value Ref Range Status  09/23/2016 FINAL  Final    Comment:    ==================================================================== TOXASSU[facility] SELECT 13 (MW) ==================================================================== Test                             Result       Flag       Units Drug Present and Declared for Prescription Verification   Tramadol  PRESENT      EXPECTED   O-Desmethyltramadol            PRESENT      EXPECTED   N-Desmethyltramadol            PRESENT      EXPECTED    Source of tramadol is a prescription medication.    O-desmethyltramadol and N-desmethyltramadol are expected    metabolites of tramadol. Drug Absent but Declared for Prescription Verification   Hydrocodone                    Not Detected UNEXPECTED ng/mg creat ==================================================================== Test                      Result    Flag    Units      Ref Range   Creatinine              41               mg/dL      >=16 ==================================================================== Declared Medications:  The flagging and interpretation on this report are based on the  following declared medications.  Unexpected results may arise from  inaccuracies in the declared medications.  **Note: The testing scope of this panel includes these medications:  Hydrocodone (Norco)  Tramadol (Ultram)  **Note: The testing scope of this panel does not include following  reported medications:  Acetaminophen (Norco)  Aspirin (Aspirin 81)  Atorvastatin (Lipitor)  Calcium  Cholecalciferol  Dicyclomine (Bentyl)  Diphenhydramine (Benadryl)  Duloxetine (Cymbalta)  Eye Drops  Multivitamin (MVI)  Ondansetron (Zofran)  Pantoprazole (Protonix)  Sucralfate (Carafate)  Trazodone (Desyrel)  Vitamin B ==================================================================== For clinical consultation, please call [phone]. ====================================================================     Outpatient Medications Prior to Visit  Medication Sig Dispense Refill  . aspirin EC 81 MG EC tablet Take 1 tablet (81 mg total) by mouth daily. 120 tablet 0  . atorvastatin (LIPITOR) 40 MG tablet Take 1 tablet (40 mg total) by mouth daily at 6 PM. 30 tablet 0  . b complex vitamins tablet Take 1 tablet by mouth daily.    Marland Kitchen BESIVANCE 0.6 % SUSP INSTILL 1 DROP both eyes at bedtime  1  . CALCIUM PO Take 1 tablet by mouth daily.    . Cholecalciferol (VITAMIN D PO) Take 1 tablet by mouth daily.    Marland Kitchen dicyclomine (BENTYL) 20 MG tablet TAKE 1 TABLET BY MOUTH 3 TIMES A DAY 90 tablet 6  . diphenhydrAMINE (BENADRYL) 25 mg capsule Take 25 mg by mouth at bedtime as needed for allergies.     . DULoxetine (CYMBALTA) 60 MG capsule Take 60 mg by mouth daily.  5  . HYDROcodone-acetaminophen (NORCO/VICODIN) 5-325 MG per tablet Take 1-2 tablets every 6 hours as  needed for severe pain 12 tablet 0  . Multiple Vitamin (MULTIVITAMIN WITH MINERALS) TABS tablet Take 1 tablet by mouth daily.    . ondansetron (ZOFRAN) 8 MG tablet Take 8 mg by mouth every 8 (eight) hours as needed for nausea or vomiting.     . pantoprazole (PROTONIX) 40 MG tablet Take 1 tablet (40 mg total) by mouth daily. 30 tablet 0  . sucralfate (CARAFATE) 1 GM/10ML suspension Take 10 mLs (1 g total) by mouth 4 (four) times daily -  with meals and at bedtime. 420 mL 0  . traMADol (ULTRAM) 50 MG tablet Take 1 tablet (50 mg total) by mouth 2 (two) times daily. 60 tablet  1  . traMADol (ULTRAM) 50 MG tablet Take 1 tablet (50 mg total) by mouth 2 (two) times daily as needed. 30 tablet 5  . traZODone (DESYREL) 100 MG tablet Take 100 mg by mouth at bedtime as needed for sleep.   11   No facility-administered medications prior to visit.      --------------------------------------------------------------------------------------------------------------------- [place] Carotid Bilateral  Result Date: 08/13/2016 CLINICAL DATA:  TIA and syncope. EXAM: BILATERAL CAROTID DUPLEX ULTRASOUND TECHNIQUE: Wallace CullensGray scale imaging, color Doppler and duplex ultrasound were performed of bilateral carotid and vertebral arteries in the neck. COMPARISON:  None. FINDINGS: Criteria: Quantification of carotid stenosis is based on velocity parameters that correlate the residual internal carotid diameter with NASCET-based stenosis levels, using the diameter of the distal internal carotid lumen as the denominator for stenosis measurement. The following velocity measurements were obtained: RIGHT ICA:  86/25 cm/sec CCA:  78/15 cm/sec SYSTOLIC ICA/CCA RATIO:  1.1 DIASTOLIC ICA/CCA RATIO:  1.7 ECA:  71 cm/sec LEFT ICA:  80/31 cm/sec CCA:  69/17 cm/sec SYSTOLIC ICA/CCA RATIO:  1.2 DIASTOLIC ICA/CCA RATIO:  1.8 ECA:  68 cm/sec RIGHT CAROTID ARTERY: Mild common carotid and carotid bulb intimal thickening without evidence of focal plaque. No  evidence of carotid stenosis. RIGHT VERTEBRAL ARTERY: Antegrade flow with normal waveform and velocity. LEFT CAROTID ARTERY: Stable mild intimal thickening of common carotid artery and carotid bulb without focal plaque or evidence of carotid stenosis. LEFT VERTEBRAL ARTERY: Antegrade flow with normal waveform and velocity. IMPRESSION: Unremarkable carotid duplex ultrasound demonstrating no evidence of carotid stenosis in the neck bilaterally. Mild intimal thickening present without focal plaque or velocity elevation. Electronically Signed   By: Irish LackGlenn  Yamagata M.D.   On: 08/13/2016 11:02       ---------------------------------------------------------------------------------------------------------------------- Past Medical History:  Diagnosis Date  . Allergy   . Anxiety   . Arthritis   . Back muscle spasm   . Chronic back pain   . Diverticulosis   . GERD (gastroesophageal reflux disease)   . IBS (irritable bowel syndrome)   . Insomnia   . Low blood monocyte count   . Osteoporosis   . Stroke [facility](HCC) 08/2016    Past Surgical History:  Procedure Laterality Date  . APPENDECTOMY    . CHOLECYSTECTOMY    . knee cast     left 2015  . uterine tumors      Family History  Problem Relation Age of Onset  . GI problems Mother   . Cancer Father     Social History  Substance Use Topics  . Smoking status: Former Games developermoker  . Smokeless tobacco: Never Used  . Alcohol use No    ---------------------------------------------------------------------------------------------------------------------- Social History   Social History  . Marital status: Married    Spouse name: N/A  . Number of children: N/A  . Years of education: N/A   Social History Main Topics  . Smoking status: Former Games developermoker  . Smokeless tobacco: Never Used  . Alcohol use No  . Drug use: No  . Sexual activity: Not Asked   Other Topics Concern  . None   Social History Narrative   Lives at home with husband.  Independent and considers herself active    Scheduled Meds: Continuous Infusions: PRN Meds:.   BP 130/74   Pulse 61   Temp 97 F (36.1 C)   Resp 16   Ht 5' (1.524 m)   Wt 130 lb (59 kg)   SpO2 100%   BMI 25.39 kg/m    BP Readings  from Last 3 Encounters:  10/05/16 130/74  09/23/16 (!) 139/59  08/13/16 140/75     Wt Readings from Last 3 Encounters:  10/05/16 130 lb (59 kg)  09/23/16 130 lb (59 kg)  08/12/16 130 lb (59 kg)     ----------------------------------------------------------------------------------------------------------------------  ROS Review of Systems  Cardiac: Daily aspirin no angina no shortness of breath or orthopnea GI: Rare constipation  Objective:  BP 130/74   Pulse 61   Temp 97 F (36.1 C)   Resp 16   Ht 5' (1.524 m)   Wt 130 lb (59 kg)   SpO2 100%   BMI 25.39 kg/m   Physical Exam Pupils are equally round reactive to light extraocular muscles are intact Patient is good historian Heart is regular rate and rhythm without murmur Lungs are clear to auscultation Inspection low back reveals some paraspinous muscle tenderness No significant changes on motor exam are noted   Assessment & Plan:   Roselia was seen today for back pain, hip pain and knee pain.  Diagnoses and all orders for this visit:  DDD (degenerative disc disease), lumbar -     Lumbar Epidural Injection -     Lumbar Epidural Injection -     Lumbar Epidural Injection; Future  Chronic left-sided low back pain with left-sided sciatica -     Lumbar Epidural Injection -     Lumbar Epidural Injection -     Lumbar Epidural Injection; Future -     triamcinolone acetonide (KENALOG-40) injection 40 mg; 1 mL (40 mg total) by Other route once. -     sodium chloride flush (NS) 0.9 % injection 10 mL; 10 mLs by Other route once. -     ropivacaine (PF) 2 mg/mL (0.2%) (NAROPIN) injection 10 mL; 10 mLs by Epidural route once. -     midazolam (VERSED) 5 MG/5ML injection 5 mg;  Inject 5 mLs (5 mg total) into the vein once. -     lidocaine (PF) (XYLOCAINE) 1 % injection 5 mL; Inject 5 mLs into the skin once. -     lactated ringers infusion 1,000 mL; Inject 1,000 mLs into the vein continuous. -     iopamidol (ISOVUE-M) 41 % intrathecal injection 20 mL; 20 mLs by Other route once as needed for contrast.  Other orders -     iopamidol (ISOVUE-M) 41 % intrathecal injection;  -     lidocaine (PF) (XYLOCAINE) 1 % injection;  -     sodium chloride 0.9 % injection;  -     ropivacaine (PF) 2 mg/mL (0.2%) (NAROPIN) 2 MG/ML injection;  -     triamcinolone acetonide (KENALOG-40) 40 MG/ML injection;  -     midazolam (VERSED) 5 MG/5ML injection;      ----------------------------------------------------------------------------------------------------------------------  Problem List Items Addressed This Visit    None    Visit Diagnoses    DDD (degenerative disc disease), lumbar       Relevant Medications   triamcinolone acetonide (KENALOG-40) 40 MG/ML injection (Completed)   triamcinolone acetonide (KENALOG-40) injection 40 mg   Other Relevant Orders   Lumbar Epidural Injection   Lumbar Epidural Injection   Chronic left-sided low back pain with left-sided sciatica       Relevant Medications   triamcinolone acetonide (KENALOG-40) 40 MG/ML injection (Completed)   triamcinolone acetonide (KENALOG-40) injection 40 mg   sodium chloride flush (NS) 0.9 % injection 10 mL   ropivacaine (PF) 2 mg/mL (0.2%) (NAROPIN) injection 10 mL   midazolam (VERSED) 5 MG/5ML  injection 5 mg   lidocaine (PF) (XYLOCAINE) 1 % injection 5 mL   lactated ringers infusion 1,000 mL   iopamidol (ISOVUE-M) 41 % intrathecal injection 20 mL   Other Relevant Orders   Lumbar Epidural Injection   Lumbar Epidural Injection      ----------------------------------------------------------------------------------------------------------------------  1. DDD (degenerative disc disease), lumbar We will  proceed with a lumbar epidural steroid injection at L4-5 today for her left side L4 radiculitis. The risks and benefits of an reviewed and full detail all questions are answered. We'll have her return to clinic in 1 month for reevaluation and possible repeat injection at that time. I've instructed her to continue with her current medication regimen and work on stretching strengthening exercises as reviewed in detail today. 2. Facet arthritis of lumbar region [facility]) Based on her lumbar x-rays and the pain she experiences with extension and left lateral rotation she may be a candidate for diagnostic lumbar facet block.  3. Chronic left-sided low back pain with left-sided sciatica   ----------------------------------------------------------------------------------------------------------------------  I am having Ms. Schunk maintain her DULoxetine, traZODone, diphenhydrAMINE, ondansetron, b complex vitamins, multivitamin with minerals, CALCIUM PO, Cholecalciferol (VITAMIN D PO), pantoprazole, sucralfate, HYDROcodone-acetaminophen, BESIVANCE, aspirin, atorvastatin, dicyclomine, traMADol, and traMADol. We administered iopamidol, lidocaine (PF), sodium chloride, ropivacaine (PF) 2 mg/mL (0.2%), triamcinolone acetonide, and midazolam. We will continue to administer triamcinolone acetonide, sodium chloride flush, ropivacaine (PF) 2 mg/mL (0.2%), midazolam, lidocaine (PF), lactated ringers, and iopamidol.   Meds ordered this encounter  Medications  . iopamidol (ISOVUE-M) 41 % intrathecal injection    GARNER, CYNTHIA: cabinet override  . lidocaine (PF) (XYLOCAINE) 1 % injection    GARNER, CYNTHIA: cabinet override  . sodium chloride 0.9 % injection    GARNER, CYNTHIA: cabinet override  . ropivacaine (PF) 2 mg/mL (0.2%) (NAROPIN) 2 MG/ML injection    GARNER, CYNTHIA: cabinet override  . triamcinolone acetonide (KENALOG-40) 40 MG/ML injection    GARNER, CYNTHIA: cabinet override  . midazolam (VERSED) 5  MG/5ML injection    GARNER, CYNTHIA: cabinet override  . triamcinolone acetonide (KENALOG-40) injection 40 mg  . sodium chloride flush (NS) 0.9 % injection 10 mL  . ropivacaine (PF) 2 mg/mL (0.2%) (NAROPIN) injection 10 mL  . midazolam (VERSED) 5 MG/5ML injection 5 mg  . lidocaine (PF) (XYLOCAINE) 1 % injection 5 mL  . lactated ringers infusion 1,000 mL  . iopamidol (ISOVUE-M) 41 % intrathecal injection 20 mL    Procedure: L4-5 left side lumbar epidural steroid under fluoroscopic guidance with moderate sedation   Procedure: L4-5 LESI with fluoroscopic guidance and moderate sedation  NOTE: The risks, benefits, and expectations of the procedure have been discussed and explained to the patient who was understanding and in agreement with suggested treatment plan. No guarantees were made.  DESCRIPTION OF PROCEDURE: Lumbar epidural steroid injection with IV Versed 2 mg, EKG, blood pressure, pulse, and pulse oximetry monitoring. The procedure was performed with the patient in the prone position under fluoroscopic guidance. A local anesthetic skin wheal of 1.5% plain lidocaine was performed at the appropriate site after fluoroscopic identifictation  Using strict aseptic technique, I then advanced an 18-gauge Tuohy epidural needle in the midline via loss-of-resistance to saline technique. There was negative aspiration for heme or  CSF.  I then confirmed position with both AP and Lateral fluoroscan. At L4-5  A total of 5 mL of Preservative-Free normal saline mixed with 40 mg of Kenalog and 1cc Ropicaine 0.2 percent was injected incrementally via the  epidurally placed needle. The  needle was removed. The patient tolerated the injection well and was convalesced and discharged to home in stable condition. Should the patient have any post procedure difficulty they have been instructed on how to contact us for assistance.     Follow-up: Return in about 1 month (around 11/05/2016) for evaluation, procedure.     Yevette Edwards, MD  The [facility] practitioner database for opioid medications on this patient has been reviewed by me and my staff   Greater than 50% of the total encounter time was spent in counseling and / or coordination of care.     This dictation was performed utilizing Conservation officer, historic buildings.  Please excuse any unintentional or mistaken typographical errors as a result.

## 2016-11-03 ENCOUNTER — Encounter: Payer: Self-pay | Admitting: Anesthesiology

## 2016-11-03 ENCOUNTER — Other Ambulatory Visit: Payer: Self-pay | Admitting: Anesthesiology

## 2016-11-03 ENCOUNTER — Ambulatory Visit (HOSPITAL_BASED_OUTPATIENT_CLINIC_OR_DEPARTMENT_OTHER): Payer: Medicare Other | Admitting: Anesthesiology

## 2016-11-03 ENCOUNTER — Ambulatory Visit
Admission: RE | Admit: 2016-11-03 | Discharge: 2016-11-03 | Disposition: A | Discharge status: Home / Self Care | Payer: Medicare Other | Source: Ambulatory Visit | Attending: Anesthesiology | Admitting: Anesthesiology

## 2016-11-03 VITALS — BP 143/75 | HR 62 | Temp 97.8°F | Resp 12 | Ht 60.0 in | Wt 135.0 lb

## 2016-11-03 DIAGNOSIS — M5386 Other specified dorsopathies, lumbar region: Secondary | ICD-10-CM

## 2016-11-03 DIAGNOSIS — M810 Age-related osteoporosis without current pathological fracture: Secondary | ICD-10-CM | POA: Diagnosis not present

## 2016-11-03 DIAGNOSIS — Z809 Family history of malignant neoplasm, unspecified: Secondary | ICD-10-CM | POA: Diagnosis not present

## 2016-11-03 DIAGNOSIS — F419 Anxiety disorder, unspecified: Secondary | ICD-10-CM | POA: Diagnosis not present

## 2016-11-03 DIAGNOSIS — M5431 Sciatica, right side: Secondary | ICD-10-CM | POA: Diagnosis not present

## 2016-11-03 DIAGNOSIS — G8929 Other chronic pain: Secondary | ICD-10-CM | POA: Insufficient documentation

## 2016-11-03 DIAGNOSIS — R52 Pain, unspecified: Secondary | ICD-10-CM

## 2016-11-03 DIAGNOSIS — M5116 Intervertebral disc disorders with radiculopathy, lumbar region: Secondary | ICD-10-CM | POA: Diagnosis not present

## 2016-11-03 DIAGNOSIS — Z8673 Personal history of transient ischemic attack (TIA), and cerebral infarction without residual deficits: Secondary | ICD-10-CM | POA: Insufficient documentation

## 2016-11-03 DIAGNOSIS — K219 Gastro-esophageal reflux disease without esophagitis: Secondary | ICD-10-CM | POA: Diagnosis not present

## 2016-11-03 DIAGNOSIS — K589 Irritable bowel syndrome without diarrhea: Secondary | ICD-10-CM | POA: Insufficient documentation

## 2016-11-03 DIAGNOSIS — M5136 Other intervertebral disc degeneration, lumbar region: Secondary | ICD-10-CM

## 2016-11-03 DIAGNOSIS — Z7982 Long term (current) use of aspirin: Secondary | ICD-10-CM | POA: Insufficient documentation

## 2016-11-03 DIAGNOSIS — M5442 Lumbago with sciatica, left side: Secondary | ICD-10-CM

## 2016-11-03 DIAGNOSIS — Z87891 Personal history of nicotine dependence: Secondary | ICD-10-CM | POA: Diagnosis not present

## 2016-11-03 DIAGNOSIS — G4700 Insomnia, unspecified: Secondary | ICD-10-CM | POA: Insufficient documentation

## 2016-11-03 DIAGNOSIS — M545 Low back pain: Secondary | ICD-10-CM | POA: Diagnosis present

## 2016-11-03 DIAGNOSIS — M51369 Other intervertebral disc degeneration, lumbar region without mention of lumbar back pain or lower extremity pain: Secondary | ICD-10-CM

## 2016-11-03 DIAGNOSIS — M1990 Unspecified osteoarthritis, unspecified site: Secondary | ICD-10-CM | POA: Insufficient documentation

## 2016-11-03 DIAGNOSIS — Z79899 Other long term (current) drug therapy: Secondary | ICD-10-CM | POA: Insufficient documentation

## 2016-11-03 MED ORDER — LACTATED RINGERS IV SOLN: 1000.0000 mL | INTRAVENOUS | Status: DC

## 2016-11-03 MED ORDER — SODIUM CHLORIDE 0.9 % IJ SOLN
INTRAMUSCULAR | Status: AC
  Administered 2016-11-03: 13:00:00
  Filled 2016-11-03: qty 10

## 2016-11-03 MED ORDER — ROPIVACAINE HCL 2 MG/ML IJ SOLN: 10.0000 mL | Freq: Once | INTRAMUSCULAR | Status: DC

## 2016-11-03 MED ORDER — MIDAZOLAM HCL 2 MG/2ML IJ SOLN
5.0000 mg | Freq: Once | INTRAMUSCULAR | Status: DC
  Filled 2016-11-03: qty 5

## 2016-11-03 MED ORDER — TRIAMCINOLONE ACETONIDE 40 MG/ML IJ SUSP
INTRAMUSCULAR | Status: AC
  Administered 2016-11-03: 13:00:00
  Filled 2016-11-03: qty 1

## 2016-11-03 MED ORDER — ROPIVACAINE HCL 2 MG/ML IJ SOLN
INTRAMUSCULAR | Status: AC
  Administered 2016-11-03: 13:00:00
  Filled 2016-11-03: qty 10

## 2016-11-03 MED ORDER — SODIUM CHLORIDE 0.9% FLUSH: 10.0000 mL | Freq: Once | INTRAVENOUS | Status: DC

## 2016-11-03 MED ORDER — MIDAZOLAM HCL 5 MG/5ML IJ SOLN
INTRAMUSCULAR | Status: AC
  Administered 2016-11-03: 2 mg via INTRAVENOUS
  Filled 2016-11-03: qty 5

## 2016-11-03 MED ORDER — IOPAMIDOL (ISOVUE-M 200) INJECTION 41%
INTRAMUSCULAR | Status: AC
  Administered 2016-11-03: 13:00:00
  Filled 2016-11-03: qty 10

## 2016-11-03 MED ORDER — IOPAMIDOL (ISOVUE-M 200) INJECTION 41%: 20.0000 mL | Freq: Once | INTRAMUSCULAR | Status: DC | PRN

## 2016-11-03 MED ORDER — LIDOCAINE HCL (PF) 1 % IJ SOLN: 5.0000 mL | Freq: Once | INTRAMUSCULAR | Status: DC

## 2016-11-03 MED ORDER — TRIAMCINOLONE ACETONIDE 40 MG/ML IJ SUSP: 40.0000 mg | Freq: Once | INTRAMUSCULAR | Status: DC

## 2016-11-03 MED ORDER — LIDOCAINE HCL (PF) 1 % IJ SOLN
INTRAMUSCULAR | Status: AC
  Administered 2016-11-03: 13:00:00
  Filled 2016-11-03: qty 10

## 2016-11-03 NOTE — Patient Instructions (Signed)
Pain Management Discharge Instructions  General Discharge Instructions :  If you need to reach your doctor call: Monday-Friday 8:00 am - 4:00 pm at [phone] or toll free [phone].  After clinic hours [phone] to have operator reach doctor.  Bring all of your medication bottles to all your appointments in the pain clinic.  To cancel or reschedule your appointment with Pain Management please remember to call 24 hours in advance to avoid a fee.  Refer to the educational materials which you have been given on: General Risks, I had my Procedure. Discharge Instructions, Post Sedation.  Post Procedure Instructions:  The drugs you were given will stay in your system until tomorrow, so for the next 24 hours you should not drive, make any legal decisions or drink any alcoholic beverages.  You may eat anything you prefer, but it is better to start with liquids then soups and crackers, and gradually work up to solid foods.  Please notify your doctor immediately if you have any unusual bleeding, trouble breathing or pain that is not related to your normal pain.  Depending on the type of procedure that was done, some parts of your body may feel week and/or numb.  This usually clears up by tonight or the next day.  Walk with the use of an assistive device or accompanied by an adult for the 24 hours.  You may use ice on the affected area for the first 24 hours.  Put ice in a Ziploc bag and cover with a towel and place against area 15 minutes on 15 minutes off.  You may switch to heat after 24 hours.GENERAL RISKS AND COMPLICATIONS  What are the risk, side effects and possible complications? Generally speaking, most procedures are safe.  However, with any procedure there are risks, side effects, and the possibility of complications.  The risks and complications are dependent upon the sites that are lesioned, or the type of nerve block to be performed.  The closer the procedure is to the spine,  the more serious the risks are.  Great care is taken when placing the radio frequency needles, block needles or lesioning probes, but sometimes complications can occur. 1. Infection: Any time there is an injection through the skin, there is a risk of infection.  This is why sterile conditions are used for these blocks.  There are four possible types of infection. 1. Localized skin infection. 2. Central Nervous System Infection-This can be in the form of Meningitis, which can be deadly. 3. Epidural Infections-This can be in the form of an epidural abscess, which can cause pressure inside of the spine, causing compression of the spinal cord with subsequent paralysis. This would require an emergency surgery to decompress, and there are no guarantees that the patient would recover from the paralysis. 4. Discitis-This is an infection of the intervertebral discs.  It occurs in about 1% of discography procedures.  It is difficult to treat and it may lead to surgery.        2. Pain: the needles have to go through skin and soft tissues, will cause soreness.       3. Damage to internal structures:  The nerves to be lesioned may be near blood vessels or    other nerves which can be potentially damaged.       4. Bleeding: Bleeding is more common if the patient is taking blood thinners such as  aspirin, Coumadin, Ticiid, Plavix, etc., or if he/she have some genetic predisposition  such as   hemophilia. Bleeding into the spinal canal can cause compression of the spinal  cord with subsequent paralysis.  This would require an emergency surgery to  decompress and there are no guarantees that the patient would recover from the  paralysis.       5. Pneumothorax:  Puncturing of a lung is a possibility, every time a needle is introduced in  the area of the chest or upper back.  Pneumothorax refers to free air around the  collapsed lung(s), inside of the thoracic cavity (chest cavity).  Another two possible  complications  related to a similar event would include: Hemothorax and Chylothorax.   These are variations of the Pneumothorax, where instead of air around the collapsed  lung(s), you may have blood or chyle, respectively.       6. Spinal headaches: They may occur with any procedures in the area of the spine.       7. Persistent CSF (Cerebro-Spinal Fluid) leakage: This is a rare problem, but may occur  with prolonged intrathecal or epidural catheters either due to the formation of a fistulous  track or a dural tear.       8. Nerve damage: By working so close to the spinal cord, there is always a possibility of  nerve damage, which could be as serious as a permanent spinal cord injury with  paralysis.       9. Death:  Although rare, severe deadly allergic reactions known as "Anaphylactic  reaction" can occur to any of the medications used.      10. Worsening of the symptoms:  We can always make thing worse.  What are the chances of something like this happening? Chances of any of this occuring are extremely low.  By statistics, you have more of a chance of getting killed in a motor vehicle accident: while driving to the hospital than any of the above occurring .  Nevertheless, you should be aware that they are possibilities.  In general, it is similar to taking a shower.  Everybody knows that you can slip, hit your head and get killed.  Does that mean that you should not shower again?  Nevertheless always keep in mind that statistics do not mean anything if you happen to be on the wrong side of them.  Even if a procedure has a 1 (one) in a 1,000,000 (million) chance of going wrong, it you happen to be that one..Also, keep in mind that by statistics, you have more of a chance of having something go wrong when taking medications.  Who should not have this procedure? If you are on a blood thinning medication (e.g. Coumadin, Plavix, see list of "Blood Thinners"), or if you have an active infection going on, you should not  have the procedure.  If you are taking any blood thinners, please inform your physician.  How should I prepare for this procedure?  Do not eat or drink anything at least six hours prior to the procedure.  Bring a driver with you .  It cannot be a taxi.  Come accompanied by an adult that can drive you back, and that is strong enough to help you if your legs get weak or numb from the local anesthetic.  Take all of your medicines the morning of the procedure with just enough water to swallow them.  If you have diabetes, make sure that you are scheduled to have your procedure done first thing in the morning, whenever possible.  If you have diabetes,   take only half of your insulin dose and notify our nurse that you have done so as soon as you arrive at the clinic.  If you are diabetic, but only take blood sugar pills (oral hypoglycemic), then do not take them on the morning of your procedure.  You may take them after you have had the procedure.  Do not take aspirin or any aspirin-containing medications, at least eleven (11) days prior to the procedure.  They may prolong bleeding.  Wear loose fitting clothing that may be easy to take off and that you would not mind if it got stained with Betadine or blood.  Do not wear any jewelry or perfume  Remove any nail coloring.  It will interfere with some of our monitoring equipment.  NOTE: Remember that this is not meant to be interpreted as a complete list of all possible complications.  Unforeseen problems may occur.  BLOOD THINNERS The following drugs contain aspirin or other products, which can cause increased bleeding during surgery and should not be taken for 2 weeks prior to and 1 week after surgery.  If you should need take something for relief of minor pain, you may take acetaminophen which is found in Tylenol,m Datril, Anacin-3 and Panadol. It is not blood thinner. The products listed below are.  Do not take any of the products listed below  in addition to any listed on your instruction sheet.  A.P.C or A.P.C with Codeine Codeine Phosphate Capsules #3 Ibuprofen Ridaura  ABC compound Congesprin Imuran rimadil  Advil Cope Indocin Robaxisal  Alka-Seltzer Effervescent Pain Reliever and Antacid Coricidin or Coricidin-D  Indomethacin Rufen  Alka-Seltzer plus Cold Medicine Cosprin Ketoprofen S-A-C Tablets  Anacin Analgesic Tablets or Capsules Coumadin Korlgesic Salflex  Anacin Extra Strength Analgesic tablets or capsules CP-2 Tablets Lanoril Salicylate  Anaprox Cuprimine Capsules Levenox Salocol  Anexsia-D Dalteparin Magan Salsalate  Anodynos Darvon compound Magnesium Salicylate Sine-off  Ansaid Dasin Capsules Magsal Sodium Salicylate  Anturane Depen Capsules Marnal Soma  APF Arthritis pain formula Dewitt's Pills Measurin Stanback  Argesic Dia-Gesic Meclofenamic Sulfinpyrazone  Arthritis Bayer Timed Release Aspirin Diclofenac Meclomen Sulindac  Arthritis pain formula Anacin Dicumarol Medipren Supac  Analgesic (Safety coated) Arthralgen Diffunasal Mefanamic Suprofen  Arthritis Strength Bufferin Dihydrocodeine Mepro Compound Suprol  Arthropan liquid Dopirydamole Methcarbomol with Aspirin Synalgos  ASA tablets/Enseals Disalcid Micrainin Tagament  Ascriptin Doan's Midol Talwin  Ascriptin A/D Dolene Mobidin Tanderil  Ascriptin Extra Strength Dolobid Moblgesic Ticlid  Ascriptin with Codeine Doloprin or Doloprin with Codeine Momentum Tolectin  Asperbuf Duoprin Mono-gesic Trendar  Aspergum Duradyne Motrin or Motrin IB Triminicin  Aspirin plain, buffered or enteric coated Durasal Myochrisine Trigesic  Aspirin Suppositories Easprin Nalfon Trillsate  Aspirin with Codeine Ecotrin Regular or Extra Strength Naprosyn Uracel  Atromid-S Efficin Naproxen Ursinus  Auranofin Capsules Elmiron Neocylate Vanquish  Axotal Emagrin Norgesic Verin  Azathioprine Empirin or Empirin with Codeine Normiflo Vitamin E  Azolid Emprazil Nuprin Voltaren  Bayer  Aspirin plain, buffered or children's or timed BC Tablets or powders Encaprin Orgaran Warfarin Sodium  Buff-a-Comp Enoxaparin Orudis Zorpin  Buff-a-Comp with Codeine Equegesic Os-Cal-Gesic   Buffaprin Excedrin plain, buffered or Extra Strength Oxalid   Bufferin Arthritis Strength Feldene Oxphenbutazone   Bufferin plain or Extra Strength Feldene Capsules Oxycodone with Aspirin   Bufferin with Codeine Fenoprofen Fenoprofen Pabalate or Pabalate-SF   Buffets II Flogesic Panagesic   Buffinol plain or Extra Strength Florinal or Florinal with Codeine Panwarfarin   Buf-Tabs Flurbiprofen Penicillamine   Butalbital Compound Four-way cold tablets   Penicillin   Butazolidin Fragmin Pepto-Bismol   Carbenicillin Geminisyn Percodan   Carna Arthritis Reliever Geopen Persantine   Carprofen Gold's salt Persistin   Chloramphenicol Goody's Phenylbutazone   Chloromycetin Haltrain Piroxlcam   Clmetidine heparin Plaquenil   Cllnoril Hyco-pap Ponstel   Clofibrate Hydroxy chloroquine Propoxyphen         Before stopping any of these medications, be sure to consult the physician who ordered them.  Some, such as Coumadin (Warfarin) are ordered to prevent or treat serious conditions such as "deep thrombosis", "pumonary embolisms", and other heart problems.  The amount of time that you may need off of the medication may also vary with the medication and the reason for which you were taking it.  If you are taking any of these medications, please make sure you notify your pain physician before you undergo any procedures.         Epidural Steroid Injection Patient Information  Description: The epidural space surrounds the nerves as they exit the spinal cord.  In some patients, the nerves can be compressed and inflamed by a bulging disc or a tight spinal canal (spinal stenosis).  By injecting steroids into the epidural space, we can bring irritated nerves into direct contact with a potentially helpful medication.   These steroids act directly on the irritated nerves and can reduce swelling and inflammation which often leads to decreased pain.  Epidural steroids may be injected anywhere along the spine and from the neck to the low back depending upon the location of your pain.   After numbing the skin with local anesthetic (like Novocaine), a small needle is passed into the epidural space slowly.  You may experience a sensation of pressure while this is being done.  The entire block usually last less than 10 minutes.  Conditions which may be treated by epidural steroids:   Low back and leg pain  Neck and arm pain  Spinal stenosis  Post-laminectomy syndrome  Herpes zoster (shingles) pain  Pain from compression fractures  Preparation for the injection:  1. Do not eat any solid food or dairy products within 8 hours of your appointment.  2. You may drink clear liquids up to 3 hours before appointment.  Clear liquids include water, black coffee, juice or soda.  No milk or cream please. 3. You may take your regular medication, including pain medications, with a sip of water before your appointment  Diabetics should hold regular insulin (if taken separately) and take 1/2 normal NPH dos the morning of the procedure.  Carry some sugar containing items with you to your appointment. 4. A driver must accompany you and be prepared to drive you home after your procedure.  5. Bring all your current medications with your. 6. An IV may be inserted and sedation may be given at the discretion of the physician.   7. A blood pressure cuff, EKG and other monitors will often be applied during the procedure.  Some patients may need to have extra oxygen administered for a short period. 8. You will be asked to provide medical information, including your allergies, prior to the procedure.  We must know immediately if you are taking blood thinners (like Coumadin/Warfarin)  Or if you are allergic to IV iodine contrast (dye). We  must know if you could possible be pregnant.  Possible side-effects:  Bleeding from needle site  Infection (rare, may require surgery)  Nerve injury (rare)  Numbness & tingling (temporary)  Difficulty urinating (rare, temporary)  Spinal headache (   a headache worse with upright posture)  Light -headedness (temporary)  Pain at injection site (several days)  Decreased blood pressure (temporary)  Weakness in arm/leg (temporary)  Pressure sensation in back/neck (temporary)  Call if you experience:  Fever/chills associated with headache or increased back/neck pain.  Headache worsened by an upright position.  New onset weakness or numbness of an extremity below the injection site  Hives or difficulty breathing (go to the emergency room)  Inflammation or drainage at the infection site  Severe back/neck pain  Any new symptoms which are concerning to you  Please note:  Although the local anesthetic injected can often make your back or neck feel good for several hours after the injection, the pain will likely return.  It takes 3-7 days for steroids to work in the epidural space.  You may not notice any pain relief for at least that one week.  If effective, we will often do a series of three injections spaced 3-6 weeks apart to maximally decrease your pain.  After the initial series, we generally will wait several months before considering a repeat injection of the same type.  If you have any questions, please call [phone] [facility] 

## 2016-11-03 NOTE — Progress Notes (Signed)
Subjective:  Patient ID: Ashley Murillo, female    DOB: 05-Feb-1946  Age: 71 y.o. MRN: [id number]  CC: Back Pain (low and radiates to legs)   Service Provided on Last Visit: Procedure (lesi)  PROCEDURE: LESI #2  HPI Ashley Murillo presents for reevaluation. She was last seen on January 3 and had her first epidural at that time. She reports a good outcome with a 50% reduction in her low back pain and elimination of her left lower extremity pain. At present she still having some low back pain of the same quality characteristic and distribution but at this time it's radiating into the right posterior lateral leg. Otherwise she is in her usual state of health and taking some tramadol periodically for pain management but otherwise no changes noted. Her bowel bladder function has been stable and strength stable.   By history he has a long-standing history of low back pain and left leg pain. She also has lumbar and thoracic pain. She reports having had previous epidural steroid injections by Dr. Chaney Malling. She reports good relief with the epidurals in the past giving her 60-75% improvement for approximately 2 months. This seems to get her pain under better control and allows her to continue with her baseline tramadol medication that she uses intermittently when her pain flares. The pain is described as a VAS of asked to worst 10 and averaging an 8. 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 It's worse with stooping walking uphill standing kneeling lifting and better with sleep warm shower TENS application or medication management. She takes tramadol up to twice a day when it severe and this seems to help significantly. The pain radiates into the left posterior and lateral leg with occasional give way weakness but no problems with bowel or bladder dysfunction. The pain is gradually been getting worse and it's been approximately a year or so since she has had  her last epidural. She's had a previous MRI showing evidence of an L4-5 disc bulge and lumbar x-rays revealing facet arthropathy.  History Ashley Murillo has a past medical history of Allergy; Anxiety; Arthritis; Back muscle spasm; Chronic back pain; Diverticulosis; GERD (gastroesophageal reflux disease); IBS (irritable bowel syndrome); Insomnia; Low blood monocyte count; Osteoporosis; and Stroke (HCC) (08/2016).   She has a past surgical history that includes Appendectomy; uterine tumors; Cholecystectomy; and knee cast.   Her family history includes Cancer in her father; GI problems in her mother.She reports that she has quit smoking. She has never used smokeless tobacco. She reports that she does not drink alcohol or use drugs.  Results for orders placed in visit on 01/10/05  [facility] W/O Cm   Narrative * PRIOR REPORT IMPORTED FROM AN EXTERNAL SYSTEM *   PRIOR REPORT IMPORTED FROM THE [facility] WORKFLOW SYSTEM   REASON FOR EXAM:  back pn w/RT leg pn and numbness  COMMENTS:   PROCEDURE:     MR  - MR LUMBAR SPINE WO CONTRAST  - Jan 10 2005  3:36PM   RESULT:     Multiplanar/multisequence imaging of the lumbar spine is  obtained. No evidence of disc protrusion or spinal stenosis. L4-L5 mild  annular bulge is present.   IMPRESSION:   1)Mild L4-L5 annular bulge. No evidence of significant disc protrusion or  spinal stenosis.        ToxAssure Select 13  Date Value Ref Range Status  09/23/2016 FINAL  Final    Comment:    ====================================================================  TOXASSURE SELECT 13 (MW) ==================================================================== Test                             Result       Flag       Units Drug Present and Declared for Prescription Verification   Tramadol                       PRESENT      EXPECTED   O-Desmethyltramadol            PRESENT      EXPECTED   N-Desmethyltramadol            PRESENT      EXPECTED    Source of tramadol  is a prescription medication.    O-desmethyltramadol and N-desmethyltramadol are expected    metabolites of tramadol. Drug Absent but Declared for Prescription Verification   Hydrocodone                    Not Detected UNEXPECTED ng/mg creat ==================================================================== Test                      Result    Flag   Units      Ref Range   Creatinine              41               mg/dL      >=69>=20 ==================================================================== Declared Medications:  The flagging and interpretation on this report are based on the  following declared medications.  Unexpected results may arise from  inaccuracies in the declared medications.  **Note: The testing scope of this panel includes these medications:  Hydrocodone (Norco)  Tramadol (Ultram)  **Note: The testing scope of this panel does not include following  reported medications:  Acetaminophen (Norco)  Aspirin (Aspirin 81)  Atorvastatin (Lipitor)  Calcium  Cholecalciferol  Dicyclomine (Bentyl)  Diphenhydramine (Benadryl)  Duloxetine (Cymbalta)  Eye Drops  Multivitamin (MVI)  Ondansetron (Zofran)  Pantoprazole (Protonix)  Sucralfate (Carafate)  Trazodone (Desyrel)  Vitamin B ==================================================================== For clinical consultation, please call [phone]. ====================================================================     Outpatient Medications Prior to Visit  Medication Sig Dispense Refill  . aspirin EC 81 MG EC tablet Take 1 tablet (81 mg total) by mouth daily. 120 tablet 0  . atorvastatin (LIPITOR) 40 MG tablet Take 1 tablet (40 mg total) by mouth daily at 6 PM. 30 tablet 0  . b complex vitamins tablet Take 1 tablet by mouth daily.    Marland Kitchen. BESIVANCE 0.6 % SUSP INSTILL 1 DROP both eyes at bedtime  1  . CALCIUM PO Take 1 tablet by mouth daily.    . Cholecalciferol (VITAMIN D PO) Take 1 tablet by mouth daily.    Marland Kitchen.  dicyclomine (BENTYL) 20 MG tablet TAKE 1 TABLET BY MOUTH 3 TIMES A DAY 90 tablet 6  . diphenhydrAMINE (BENADRYL) 25 mg capsule Take 25 mg by mouth at bedtime as needed for allergies.     . DULoxetine (CYMBALTA) 60 MG capsule Take 60 mg by mouth daily.  5  . HYDROcodone-acetaminophen (NORCO/VICODIN) 5-325 MG per tablet Take 1-2 tablets every 6 hours as needed for severe pain 12 tablet 0  . Multiple Vitamin (MULTIVITAMIN WITH MINERALS) TABS tablet Take 1 tablet by mouth daily.    . ondansetron (ZOFRAN) 8 MG tablet Take 8 mg by mouth every  8 (eight) hours as needed for nausea or vomiting.     . pantoprazole (PROTONIX) 40 MG tablet Take 1 tablet (40 mg total) by mouth daily. 30 tablet 0  . sucralfate (CARAFATE) 1 GM/10ML suspension Take 10 mLs (1 g total) by mouth 4 (four) times daily -  with meals and at bedtime. 420 mL 0  . traZODone (DESYREL) 100 MG tablet Take 100 mg by mouth at bedtime as needed for sleep.   11  . traMADol (ULTRAM) 50 MG tablet Take 1 tablet (50 mg total) by mouth 2 (two) times daily. (Patient not taking: Reported on 11/03/2016) 60 tablet 1  . traMADol (ULTRAM) 50 MG tablet Take 1 tablet (50 mg total) by mouth 2 (two) times daily as needed. (Patient not taking: Reported on 11/03/2016) 30 tablet 5   Facility-Administered Medications Prior to Visit  Medication Dose Route Frequency Provider Last Rate Last Dose  . iopamidol (ISOVUE-M) 41 % intrathecal injection 20 mL  20 mL Other Once PRN Yevette Edwards, MD      . lactated ringers infusion 1,000 mL  1,000 mL Intravenous Continuous Yevette Edwards, MD      . lidocaine (PF) (XYLOCAINE) 1 % injection 5 mL  5 mL Subcutaneous Once Yevette Edwards, MD      . midazolam (VERSED) 5 MG/5ML injection 5 mg  5 mg Intravenous Once Yevette Edwards, MD      . ropivacaine (PF) 2 mg/mL (0.2%) (NAROPIN) injection 10 mL  10 mL Epidural Once Yevette Edwards, MD      . sodium chloride flush (NS) 0.9 % injection 10 mL  10 mL Other Once Yevette Edwards, MD      .  triamcinolone acetonide (KENALOG-40) injection 40 mg  40 mg Other Once Yevette Edwards, MD         --------------------------------------------------------------------------------------------------------------------- US Carotid Bilateral  Result Date: 08/13/2016 CLINICAL DATA:  TIA and syncope. EXAM: BILATERAL CAROTID DUPLEX ULTRASOUND TECHNIQUE: Wallace Cullens scale imaging, color Doppler and duplex ultrasound were performed of bilateral carotid and vertebral arteries in the neck. COMPARISON:  None. FINDINGS: Criteria: Quantification of carotid stenosis is based on velocity parameters that correlate the residual internal carotid diameter with NASCET-based stenosis levels, using the diameter of the distal internal carotid lumen as the denominator for stenosis measurement. The following velocity measurements were obtained: RIGHT ICA:  86/25 cm/sec CCA:  78/15 cm/sec SYSTOLIC ICA/CCA RATIO:  1.1 DIASTOLIC ICA/CCA RATIO:  1.7 ECA:  71 cm/sec LEFT ICA:  80/31 cm/sec CCA:  69/17 cm/sec SYSTOLIC ICA/CCA RATIO:  1.2 DIASTOLIC ICA/CCA RATIO:  1.8 ECA:  68 cm/sec RIGHT CAROTID ARTERY: Mild common carotid and carotid bulb intimal thickening without evidence of focal plaque. No evidence of carotid stenosis. RIGHT VERTEBRAL ARTERY: Antegrade flow with normal waveform and velocity. LEFT CAROTID ARTERY: Stable mild intimal thickening of common carotid artery and carotid bulb without focal plaque or evidence of carotid stenosis. LEFT VERTEBRAL ARTERY: Antegrade flow with normal waveform and velocity. IMPRESSION: Unremarkable carotid duplex ultrasound demonstrating no evidence of carotid stenosis in the neck bilaterally. Mild intimal thickening present without focal plaque or velocity elevation. Electronically Signed   By: Irish Lack M.D.   On: 08/13/2016 11:02       ---------------------------------------------------------------------------------------------------------------------- Past Medical History:  Diagnosis Date   . Allergy   . Anxiety   . Arthritis   . Back muscle spasm   . Chronic back pain   . Diverticulosis   . GERD (gastroesophageal reflux disease)   .  IBS (irritable bowel syndrome)   . Insomnia   . Low blood monocyte count   . Osteoporosis   . Stroke [facility]) 08/2016    Past Surgical History:  Procedure Laterality Date  . APPENDECTOMY    . CHOLECYSTECTOMY    . knee cast     left 2015  . uterine tumors      Family History  Problem Relation Age of Onset  . GI problems Mother   . Cancer Father     Social History  Substance Use Topics  . Smoking status: Former Games developer  . Smokeless tobacco: Never Used  . Alcohol use No    ---------------------------------------------------------------------------------------------------------------------- Social History   Social History  . Marital status: Married    Spouse name: N/A  . Number of children: N/A  . Years of education: N/A   Social History Main Topics  . Smoking status: Former Games developer  . Smokeless tobacco: Never Used  . Alcohol use No  . Drug use: No  . Sexual activity: Not Asked   Other Topics Concern  . None   Social History Narrative   Lives at home with husband. Independent and considers herself active    Scheduled Meds: Continuous Infusions: PRN Meds:.   BP (!) 143/75   Pulse 62   Temp 97.8 F (36.6 C)   Resp 12   Ht 5' (1.524 m)   Wt 135 lb (61.2 kg)   SpO2 98%   BMI 26.37 kg/m    BP Readings from Last 3 Encounters:  11/03/16 (!) 143/75  10/05/16 130/74  09/23/16 (!) 139/59     Wt Readings from Last 3 Encounters:  11/03/16 135 lb (61.2 kg)  10/05/16 130 lb (59 kg)  09/23/16 130 lb (59 kg)     ----------------------------------------------------------------------------------------------------------------------  ROS Review of Systems  Cardiac: Daily aspirin no angina no shortness of breath or orthopnea GI: Rare constipationWith no other changes noted  Objective:  BP (!) 143/75    Pulse 62   Temp 97.8 F (36.6 C)   Resp 12   Ht 5' (1.524 m)   Wt 135 lb (61.2 kg)   SpO2 98%   BMI 26.37 kg/m   Physical Exam Pupils are equally round reactive to light extraocular muscles are intact Patient is good historian Heart is regular rate and rhythm without murmur No other changes are noted on lower extremity strength examination. She has some mild pain on extension with right lateral rotation at the low back  Assessment & Plan:   Chavy was seen today for back pain.  Diagnoses and all orders for this visit:  Sciatica of right side associated with disorder of lumbar spine -     triamcinolone acetonide (KENALOG-40) injection 40 mg; 1 mL (40 mg total) by Other route once. -     sodium chloride flush (NS) 0.9 % injection 10 mL; 10 mLs by Other route once. -     ropivacaine (PF) 2 mg/mL (0.2%) (NAROPIN) injection 10 mL; 10 mLs by Epidural route once. -     midazolam (VERSED) injection 5 mg; Inject 5 mLs (5 mg total) into the vein once. -     lidocaine (PF) (XYLOCAINE) 1 % injection 5 mL; Inject 5 mLs into the skin once. -     lactated ringers infusion 1,000 mL; Inject 1,000 mLs into the vein continuous. -     iopamidol (ISOVUE-M) 41 % intrathecal injection 20 mL; 20 mLs by Other route once as needed for contrast. -  Lumbar Epidural Injection; Future  DDD (degenerative disc disease), lumbar -     Lumbar Epidural Injection -     Lumbar Epidural Injection; Future  Chronic left-sided low back pain with left-sided sciatica -     Lumbar Epidural Injection -     triamcinolone acetonide (KENALOG-40) injection 40 mg; 1 mL (40 mg total) by Other route once. -     sodium chloride flush (NS) 0.9 % injection 10 mL; 10 mLs by Other route once. -     ropivacaine (PF) 2 mg/mL (0.2%) (NAROPIN) injection 10 mL; 10 mLs by Epidural route once. -     midazolam (VERSED) injection 5 mg; Inject 5 mLs (5 mg total) into the vein once. -     lidocaine (PF) (XYLOCAINE) 1 % injection 5 mL;  Inject 5 mLs into the skin once. -     lactated ringers infusion 1,000 mL; Inject 1,000 mLs into the vein continuous. -     iopamidol (ISOVUE-M) 41 % intrathecal injection 20 mL; 20 mLs by Other route once as needed for contrast. -     Lumbar Epidural Injection; Future  Other orders -     ropivacaine (PF) 2 mg/mL (0.2%) (NAROPIN) 2 MG/ML injection;  -     iopamidol (ISOVUE-M) 41 % intrathecal injection;  -     lidocaine (PF) (XYLOCAINE) 1 % injection;  -     sodium chloride 0.9 % injection;  -     triamcinolone acetonide (KENALOG-40) 40 MG/ML injection;  -     midazolam (VERSED) 5 MG/5ML injection;      ----------------------------------------------------------------------------------------------------------------------  Problem List Items Addressed This Visit    None    Visit Diagnoses    Sciatica of right side associated with disorder of lumbar spine    -  Primary   Relevant Medications   triamcinolone acetonide (KENALOG-40) injection 40 mg   sodium chloride flush (NS) 0.9 % injection 10 mL   ropivacaine (PF) 2 mg/mL (0.2%) (NAROPIN) injection 10 mL   midazolam (VERSED) injection 5 mg   lidocaine (PF) (XYLOCAINE) 1 % injection 5 mL   lactated ringers infusion 1,000 mL   iopamidol (ISOVUE-M) 41 % intrathecal injection 20 mL   midazolam (VERSED) 5 MG/5ML injection (Completed)   Other Relevant Orders   Lumbar Epidural Injection   DDD (degenerative disc disease), lumbar       Relevant Medications   triamcinolone acetonide (KENALOG-40) injection 40 mg   triamcinolone acetonide (KENALOG-40) 40 MG/ML injection (Completed)   Other Relevant Orders   Lumbar Epidural Injection   Chronic left-sided low back pain with left-sided sciatica       Relevant Medications   triamcinolone acetonide (KENALOG-40) injection 40 mg   sodium chloride flush (NS) 0.9 % injection 10 mL   ropivacaine (PF) 2 mg/mL (0.2%) (NAROPIN) injection 10 mL   midazolam (VERSED) injection 5 mg   lidocaine (PF)  (XYLOCAINE) 1 % injection 5 mL   lactated ringers infusion 1,000 mL   iopamidol (ISOVUE-M) 41 % intrathecal injection 20 mL   triamcinolone acetonide (KENALOG-40) 40 MG/ML injection (Completed)   Other Relevant Orders   Lumbar Epidural Injection      ----------------------------------------------------------------------------------------------------------------------  1. DDD (degenerative disc disease), lumbar As reviewed today we will proceed with her second epidural injection today we'll use an interlaminar approach and hopefully be able to get some spread over into the right lower lumbar nerve roots. This would be to address the pain complaint of the right L5 radiculitis. She  is also to continue with back stretching strengthening exercises as reviewed in detail today with return to clinic in 1 month for reevaluation possible repeat injection at that time.  2. Facet arthritis of lumbar region [facility]) Based on her lumbar x-rays and the pain she experiences with extension and left lateral rotation she may be a candidate for diagnostic lumbar facet block.  3. Chronic left-sided low back pain with left-sided sciatica   ----------------------------------------------------------------------------------------------------------------------  I am having Ms. Ida maintain her DULoxetine, traZODone, diphenhydrAMINE, ondansetron, b complex vitamins, multivitamin with minerals, CALCIUM PO, Cholecalciferol (VITAMIN D PO), pantoprazole, sucralfate, HYDROcodone-acetaminophen, BESIVANCE, aspirin, atorvastatin, dicyclomine, traMADol, and traMADol. We administered ropivacaine (PF) 2 mg/mL (0.2%), iopamidol, lidocaine (PF), sodium chloride, triamcinolone acetonide, and midazolam. We will continue to administer triamcinolone acetonide, sodium chloride flush, ropivacaine (PF) 2 mg/mL (0.2%), midazolam, lidocaine (PF), lactated ringers, iopamidol, triamcinolone acetonide, sodium chloride flush, ropivacaine (PF) 2  mg/mL (0.2%), midazolam, lidocaine (PF), lactated ringers, and iopamidol.   Meds ordered this encounter  Medications  . triamcinolone acetonide (KENALOG-40) injection 40 mg  . sodium chloride flush (NS) 0.9 % injection 10 mL  . ropivacaine (PF) 2 mg/mL (0.2%) (NAROPIN) injection 10 mL  . midazolam (VERSED) injection 5 mg  . lidocaine (PF) (XYLOCAINE) 1 % injection 5 mL  . lactated ringers infusion 1,000 mL  . iopamidol (ISOVUE-M) 41 % intrathecal injection 20 mL  . ropivacaine (PF) 2 mg/mL (0.2%) (NAROPIN) 2 MG/ML injection    Roney Mans L: cabinet override  . iopamidol (ISOVUE-M) 41 % intrathecal injection    Roney Mans L: cabinet override  . lidocaine (PF) (XYLOCAINE) 1 % injection    Roney Mans L: cabinet override  . sodium chloride 0.9 % injection    Roney Mans L: cabinet override  . triamcinolone acetonide (KENALOG-40) 40 MG/ML injection    Roney Mans L: cabinet override  . midazolam (VERSED) 5 MG/5ML injection    Roney Mans L: cabinet override     Procedure: L5-S1 LESI or 2 with fluoroscopic guidance and moderate sedation  NOTE: The risks, benefits, and expectations of the procedure have been discussed and explained to the patient who was understanding and in agreement with suggested treatment plan. No guarantees were made.  DESCRIPTION OF PROCEDURE: Lumbar epidural steroid injection with IV Versed 2 mg, EKG, blood pressure, pulse, and pulse oximetry monitoring. The procedure was performed with the patient in the prone position under fluoroscopic guidance. A local anesthetic skin wheal of 1.5% plain lidocaine was performed at the appropriate site after fluoroscopic identifictation  Using strict aseptic technique, I then advanced an 18-gauge Tuohy epidural needle in the midline via loss-of-resistance to saline technique. There was negative aspiration for heme or  CSF.  I then confirmed position with both AP and Lateral fluoroscan. At L5 S1  A total of  5 mL of Preservative-Free normal saline mixed with 40 mg of Kenalog and 1cc Ropicaine 0.2 percent was injected incrementally via the  epidurally placed needle. The needle was removed. The patient tolerated the injection well and was convalesced and discharged to home in stable condition. Should the patient have any post procedure difficulty they have been instructed on how to contact us for assistance.     Follow-up: Return for evaluation, procedure.    Yevette Edwards, MD  The [place] practitioner database for opioid medications on this patient has been reviewed by me and my staff   Greater than 50% of the total encounter time was spent in counseling and / or  coordination of care.     This dictation was performed utilizing Conservation officer, historic buildings.  Please excuse any unintentional or mistaken typographical errors as a result.

## 2016-11-04 ENCOUNTER — Telehealth: Payer: Self-pay

## 2016-11-04 NOTE — Telephone Encounter (Signed)
Post procedure phone call.  Left message.  

## 2016-12-05 ENCOUNTER — Other Ambulatory Visit: Payer: Self-pay | Admitting: Anesthesiology

## 2016-12-05 ENCOUNTER — Ambulatory Visit
Admission: RE | Admit: 2016-12-05 | Discharge: 2016-12-05 | Disposition: A | Discharge status: Home / Self Care | Payer: Medicare Other | Source: Ambulatory Visit | Attending: Anesthesiology | Admitting: Anesthesiology

## 2016-12-05 ENCOUNTER — Ambulatory Visit: Payer: Medicare Other | Attending: Anesthesiology | Admitting: Anesthesiology

## 2016-12-05 ENCOUNTER — Encounter: Payer: Self-pay | Admitting: Anesthesiology

## 2016-12-05 VITALS — BP 133/62 | HR 66 | Temp 96.7°F | Resp 14 | Ht 60.0 in | Wt 136.0 lb

## 2016-12-05 DIAGNOSIS — Z7982 Long term (current) use of aspirin: Secondary | ICD-10-CM | POA: Insufficient documentation

## 2016-12-05 DIAGNOSIS — Z87891 Personal history of nicotine dependence: Secondary | ICD-10-CM | POA: Insufficient documentation

## 2016-12-05 DIAGNOSIS — M62830 Muscle spasm of back: Secondary | ICD-10-CM | POA: Diagnosis not present

## 2016-12-05 DIAGNOSIS — M1990 Unspecified osteoarthritis, unspecified site: Secondary | ICD-10-CM | POA: Insufficient documentation

## 2016-12-05 DIAGNOSIS — Z8673 Personal history of transient ischemic attack (TIA), and cerebral infarction without residual deficits: Secondary | ICD-10-CM | POA: Insufficient documentation

## 2016-12-05 DIAGNOSIS — M5442 Lumbago with sciatica, left side: Secondary | ICD-10-CM | POA: Diagnosis not present

## 2016-12-05 DIAGNOSIS — K219 Gastro-esophageal reflux disease without esophagitis: Secondary | ICD-10-CM | POA: Insufficient documentation

## 2016-12-05 DIAGNOSIS — M5386 Other specified dorsopathies, lumbar region: Secondary | ICD-10-CM

## 2016-12-05 DIAGNOSIS — M545 Low back pain: Secondary | ICD-10-CM | POA: Diagnosis present

## 2016-12-05 DIAGNOSIS — F419 Anxiety disorder, unspecified: Secondary | ICD-10-CM | POA: Diagnosis not present

## 2016-12-05 DIAGNOSIS — G8929 Other chronic pain: Secondary | ICD-10-CM | POA: Insufficient documentation

## 2016-12-05 DIAGNOSIS — Z79899 Other long term (current) drug therapy: Secondary | ICD-10-CM | POA: Insufficient documentation

## 2016-12-05 DIAGNOSIS — M5136 Other intervertebral disc degeneration, lumbar region: Secondary | ICD-10-CM | POA: Insufficient documentation

## 2016-12-05 DIAGNOSIS — R52 Pain, unspecified: Secondary | ICD-10-CM

## 2016-12-05 DIAGNOSIS — G4700 Insomnia, unspecified: Secondary | ICD-10-CM | POA: Diagnosis not present

## 2016-12-05 DIAGNOSIS — K589 Irritable bowel syndrome without diarrhea: Secondary | ICD-10-CM | POA: Insufficient documentation

## 2016-12-05 DIAGNOSIS — M5441 Lumbago with sciatica, right side: Secondary | ICD-10-CM | POA: Insufficient documentation

## 2016-12-05 DIAGNOSIS — M810 Age-related osteoporosis without current pathological fracture: Secondary | ICD-10-CM | POA: Insufficient documentation

## 2016-12-05 DIAGNOSIS — M539 Dorsopathy, unspecified: Secondary | ICD-10-CM

## 2016-12-05 MED ORDER — MIDAZOLAM HCL 5 MG/5ML IJ SOLN
5.0000 mg | Freq: Once | INTRAMUSCULAR | Status: AC
  Administered 2016-12-05: 2 mg via INTRAVENOUS
  Filled 2016-12-05: qty 5

## 2016-12-05 MED ORDER — TRIAMCINOLONE ACETONIDE 40 MG/ML IJ SUSP
40.0000 mg | Freq: Once | INTRAMUSCULAR | Status: AC
Start: 2016-12-05 — End: 2016-12-05
  Administered 2016-12-05: 40 mg
  Filled 2016-12-05: qty 1

## 2016-12-05 MED ORDER — SODIUM CHLORIDE 0.9% FLUSH
10.0000 mL | Freq: Once | INTRAVENOUS | Status: AC
  Administered 2016-12-05: 5 mL

## 2016-12-05 MED ORDER — IOPAMIDOL (ISOVUE-M 200) INJECTION 41%
20.0000 mL | Freq: Once | INTRAMUSCULAR | Status: DC | PRN
  Administered 2016-12-05: 10 mL
  Filled 2016-12-05: qty 20

## 2016-12-05 MED ORDER — LACTATED RINGERS IV SOLN
1000.0000 mL | INTRAVENOUS | Status: DC
  Administered 2016-12-05: 1000 mL via INTRAVENOUS

## 2016-12-05 MED ORDER — LIDOCAINE HCL (PF) 1 % IJ SOLN
5.0000 mL | Freq: Once | INTRAMUSCULAR | Status: AC
  Administered 2016-12-05: 5 mL via SUBCUTANEOUS
  Filled 2016-12-05: qty 5

## 2016-12-05 MED ORDER — ROPIVACAINE HCL 5 MG/ML IJ SOLN
20.0000 mg | Freq: Once | INTRAMUSCULAR | Status: AC
  Administered 2016-12-05: 5 mg via EPIDURAL
  Filled 2016-12-05: qty 20

## 2016-12-05 NOTE — Progress Notes (Signed)
Nursing Pain Medication Assessment:  Safety precautions to be maintained throughout the outpatient stay will include: orient to surroundings, keep bed in low position, maintain call bell within reach at all times, provide assistance with transfer out of bed and ambulation.  Medication Inspection Compliance: Pill count conducted under aseptic conditions, in front of the patient. Neither the pills nor the bottle was removed from the patient's sight at any time. Once count was completed pills were immediately returned to the patient in their original bottle.  Medication: Tramadol (Ultram) Pill/Patch Count: 50 of 120 pills remain Bottle Appearance: Standard pharmacy container. Clearly labeled. Filled Date:11 / 28 /2017 last Medication intake:  Yesterday

## 2016-12-05 NOTE — Progress Notes (Signed)
Subjective:  Patient ID: Ashley Murillo, female    DOB: Jun 24, 1946  Age: 71 y.o. MRN: [id number]  CC: Back Pain (lower)   Service Provided on Last Visit: Procedure  PROCEDURE: LESI #3 under fluoroscopic guidance and moderate sedation  HPI Zuleima Kassing presents for reevaluation. She is here today for her third epidural injection. She continues to derive good relief from these injections. Her most recent injection was in January at which point she had her second injection for her low back pain and primarily left greater than right lower extremity pain. She states that the quality characteristic addition we should've her pain are stable in nature.   By history he has a long-standing history of low back pain and left leg pain. She also has lumbar and thoracic pain. She reports having had previous epidural steroid injections by Dr. Chaney Malling. She reports good relief with the epidurals in the past giving her 60-75% improvement for approximately 2 months. This seems to get her pain under better control and allows her to continue with her baseline tramadol medication that she uses intermittently when her pain flares. The pain is described as a VAS of asked to worst 10 and averaging an 8. 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 It's worse with stooping walking uphill standing kneeling lifting and better with sleep warm shower TENS application or medication management. She takes tramadol up to twice a day when it severe and this seems to help significantly. The pain radiates into the left posterior and lateral leg with occasional give way weakness but no problems with bowel or bladder dysfunction. The pain is gradually been getting worse and it's been approximately a year or so since she has had her last epidural. She's had a previous MRI showing evidence of an L4-5 disc bulge and lumbar x-rays revealing facet arthropathy.  History Lashonne has a past  medical history of Allergy; Anxiety; Arthritis; Back muscle spasm; Chronic back pain; Diverticulosis; GERD (gastroesophageal reflux disease); IBS (irritable bowel syndrome); Insomnia; Low blood monocyte count; Osteoporosis; and Stroke (HCC) (08/2016).   She has a past surgical history that includes Appendectomy; uterine tumors; Cholecystectomy; and knee cast.   Her family history includes Cancer in her father; GI problems in her mother.She reports that she has quit smoking. She has never used smokeless tobacco. She reports that she does not drink alcohol or use drugs.  Results for orders placed in visit on 01/10/05  [facility] W/O Cm   Narrative * PRIOR REPORT IMPORTED FROM AN EXTERNAL SYSTEM *   PRIOR REPORT IMPORTED FROM THE [facility] WORKFLOW SYSTEM   REASON FOR EXAM:  back pn w/RT leg pn and numbness  COMMENTS:   PROCEDURE:     MR  - MR LUMBAR SPINE WO CONTRAST  - Jan 10 2005  3:36PM   RESULT:     Multiplanar/multisequence imaging of the lumbar spine is  obtained. No evidence of disc protrusion or spinal stenosis. L4-L5 mild  annular bulge is present.   IMPRESSION:   1)Mild L4-L5 annular bulge. No evidence of significant disc protrusion or  spinal stenosis.        Tox[facility]ure Select 13  Date Value Ref Range Status  09/23/2016 FINAL  Final    Comment:    ==================================================================== TOXASSURE SELECT 13 (MW) ==================================================================== Test  Result       Flag       Units Drug Present and Declared for Prescription Verification   Tramadol                       PRESENT      EXPECTED   O-Desmethyltramadol            PRESENT      EXPECTED   N-Desmethyltramadol            PRESENT      EXPECTED    Source of tramadol is a prescription medication.    O-desmethyltramadol and N-desmethyltramadol are expected    metabolites of tramadol. Drug Absent but Declared for Prescription  Verification   Hydrocodone                    Not Detected UNEXPECTED ng/mg creat ==================================================================== Test                      Result    Flag   Units      Ref Range   Creatinine              41               mg/dL      >=96 ==================================================================== Declared Medications:  The flagging and interpretation on this report are based on the  following declared medications.  Unexpected results may arise from  inaccuracies in the declared medications.  **Note: The testing scope of this panel includes these medications:  Hydrocodone (Norco)  Tramadol (Ultram)  **Note: The testing scope of this panel does not include following  reported medications:  Acetaminophen (Norco)  Aspirin (Aspirin 81)  Atorvastatin (Lipitor)  Calcium  Cholecalciferol  Dicyclomine (Bentyl)  Diphenhydramine (Benadryl)  Duloxetine (Cymbalta)  Eye Drops  Multivitamin (MVI)  Ondansetron (Zofran)  Pantoprazole (Protonix)  Sucralfate (Carafate)  Trazodone (Desyrel)  Vitamin B ==================================================================== For clinical consultation, please call [phone]. ====================================================================     Outpatient Medications Prior to Visit  Medication Sig Dispense Refill  . aspirin EC 81 MG EC tablet Take 1 tablet (81 mg total) by mouth daily. 120 tablet 0  . atorvastatin (LIPITOR) 40 MG tablet Take 1 tablet (40 mg total) by mouth daily at 6 PM. 30 tablet 0  . b complex vitamins tablet Take 1 tablet by mouth daily.    Marland Kitchen BESIVANCE 0.6 % SUSP INSTILL 1 DROP both eyes at bedtime  1  . CALCIUM PO Take 1 tablet by mouth daily.    . Cholecalciferol (VITAMIN D PO) Take 1 tablet by mouth daily.    Marland Kitchen dicyclomine (BENTYL) 20 MG tablet TAKE 1 TABLET BY MOUTH 3 TIMES A DAY 90 tablet 6  . diphenhydrAMINE (BENADRYL) 25 mg capsule Take 25 mg by mouth at bedtime as  needed for allergies.     . DULoxetine (CYMBALTA) 60 MG capsule Take 60 mg by mouth daily.  5  . HYDROcodone-acetaminophen (NORCO/VICODIN) 5-325 MG per tablet Take 1-2 tablets every 6 hours as needed for severe pain 12 tablet 0  . Multiple Vitamin (MULTIVITAMIN WITH MINERALS) TABS tablet Take 1 tablet by mouth daily.    . ondansetron (ZOFRAN) 8 MG tablet Take 8 mg by mouth every 8 (eight) hours as needed for nausea or vomiting.     . pantoprazole (PROTONIX) 40 MG tablet Take 1 tablet (40 mg total) by mouth daily. 30 tablet 0  . traMADol (  ULTRAM) 50 MG tablet Take 1 tablet (50 mg total) by mouth 2 (two) times daily. (Patient taking differently: Take 50 mg by mouth 2 (two) times daily. ) 60 tablet 1  . traMADol (ULTRAM) 50 MG tablet Take 1 tablet (50 mg total) by mouth 2 (two) times daily as needed. 30 tablet 5  . traZODone (DESYREL) 100 MG tablet Take 100 mg by mouth at bedtime as needed for sleep.   11  . sucralfate (CARAFATE) 1 GM/10ML suspension Take 10 mLs (1 g total) by mouth 4 (four) times daily -  with meals and at bedtime. (Patient not taking: Reported on 12/05/2016) 420 mL 0   Facility-Administered Medications Prior to Visit  Medication Dose Route Frequency Provider Last Rate Last Dose  . iopamidol (ISOVUE-M) 41 % intrathecal injection 20 mL  20 mL Other Once PRN Yevette Edwards, MD      . iopamidol (ISOVUE-M) 41 % intrathecal injection 20 mL  20 mL Other Once PRN Yevette Edwards, MD      . lactated ringers infusion 1,000 mL  1,000 mL Intravenous Continuous Yevette Edwards, MD      . lactated ringers infusion 1,000 mL  1,000 mL Intravenous Continuous Yevette Edwards, MD      . lidocaine (PF) (XYLOCAINE) 1 % injection 5 mL  5 mL Subcutaneous Once Yevette Edwards, MD      . lidocaine (PF) (XYLOCAINE) 1 % injection 5 mL  5 mL Subcutaneous Once Yevette Edwards, MD      . midazolam (VERSED) 5 MG/5ML injection 5 mg  5 mg Intravenous Once Yevette Edwards, MD      . midazolam (VERSED) injection 5 mg  5 mg  Intravenous Once Yevette Edwards, MD      . ropivacaine (PF) 2 mg/mL (0.2%) (NAROPIN) injection 10 mL  10 mL Epidural Once Yevette Edwards, MD      . ropivacaine (PF) 2 mg/mL (0.2%) (NAROPIN) injection 10 mL  10 mL Epidural Once Yevette Edwards, MD      . sodium chloride flush (NS) 0.9 % injection 10 mL  10 mL Other Once Yevette Edwards, MD      . sodium chloride flush (NS) 0.9 % injection 10 mL  10 mL Other Once Yevette Edwards, MD      . triamcinolone acetonide (KENALOG-40) injection 40 mg  40 mg Other Once Yevette Edwards, MD      . triamcinolone acetonide (KENALOG-40) injection 40 mg  40 mg Other Once Yevette Edwards, MD         --------------------------------------------------------------------------------------------------------------------- US Carotid Bilateral  Result Date: 08/13/2016 CLINICAL DATA:  TIA and syncope. EXAM: BILATERAL CAROTID DUPLEX ULTRASOUND TECHNIQUE: Wallace Cullens scale imaging, color Doppler and duplex ultrasound were performed of bilateral carotid and vertebral arteries in the neck. COMPARISON:  None. FINDINGS: Criteria: Quantification of carotid stenosis is based on velocity parameters that correlate the residual internal carotid diameter with NASCET-based stenosis levels, using the diameter of the distal internal carotid lumen as the denominator for stenosis measurement. The following velocity measurements were obtained: RIGHT ICA:  86/25 cm/sec CCA:  78/15 cm/sec SYSTOLIC ICA/CCA RATIO:  1.1 DIASTOLIC ICA/CCA RATIO:  1.7 ECA:  71 cm/sec LEFT ICA:  80/31 cm/sec CCA:  69/17 cm/sec SYSTOLIC ICA/CCA RATIO:  1.2 DIASTOLIC ICA/CCA RATIO:  1.8 ECA:  68 cm/sec RIGHT CAROTID ARTERY: Mild common carotid and carotid bulb intimal thickening without evidence of focal plaque. No evidence of carotid stenosis.  RIGHT VERTEBRAL ARTERY: Antegrade flow with normal waveform and velocity. LEFT CAROTID ARTERY: Stable mild intimal thickening of common carotid artery and carotid bulb without focal plaque or evidence  of carotid stenosis. LEFT VERTEBRAL ARTERY: Antegrade flow with normal waveform and velocity. IMPRESSION: Unremarkable carotid duplex ultrasound demonstrating no evidence of carotid stenosis in the neck bilaterally. Mild intimal thickening present without focal plaque or velocity elevation. Electronically Signed   By: Irish Lack M.D.   On: 08/13/2016 11:02       ---------------------------------------------------------------------------------------------------------------------- Past Medical History:  Diagnosis Date  . Allergy   . Anxiety   . Arthritis   . Back muscle spasm   . Chronic back pain   . Diverticulosis   . GERD (gastroesophageal reflux disease)   . IBS (irritable bowel syndrome)   . Insomnia   . Low blood monocyte count   . Osteoporosis   . Stroke [facility]) 08/2016    Past Surgical History:  Procedure Laterality Date  . APPENDECTOMY    . CHOLECYSTECTOMY    . knee cast     left 2015  . uterine tumors      Family History  Problem Relation Age of Onset  . GI problems Mother   . Cancer Father     Social History  Substance Use Topics  . Smoking status: Former Games developer  . Smokeless tobacco: Never Used  . Alcohol use No    ---------------------------------------------------------------------------------------------------------------------- Social History   Social History  . Marital status: Married    Spouse name: N/A  . Number of children: N/A  . Years of education: N/A   Social History Main Topics  . Smoking status: Former Games developer  . Smokeless tobacco: Never Used  . Alcohol use No  . Drug use: No  . Sexual activity: Not Asked   Other Topics Concern  . None   Social History Narrative   Lives at home with husband. Independent and considers herself active    Scheduled Meds: Continuous Infusions: PRN Meds:.   BP 133/62   Pulse 66   Temp (!) 96.7 F (35.9 C)   Resp 14   Ht 5' (1.524 m)   Wt 136 lb (61.7 kg)   SpO2 100%   BMI 26.56 kg/m     BP Readings from Last 3 Encounters:  12/05/16 133/62  11/03/16 (!) 143/75  10/05/16 130/74     Wt Readings from Last 3 Encounters:  12/05/16 136 lb (61.7 kg)  11/03/16 135 lb (61.2 kg)  10/05/16 130 lb (59 kg)     ----------------------------------------------------------------------------------------------------------------------  ROS Review of Systems  Cardiac: Daily aspirin no angina no shortness of breath or orthopnea GI: Rare constipationWith no other changes noted once again  Objective:  BP 133/62   Pulse 66   Temp (!) 96.7 F (35.9 C)   Resp 14   Ht 5' (1.524 m)   Wt 136 lb (61.7 kg)   SpO2 100%   BMI 26.56 kg/m   Physical Exam Pupils are equally round reactive to light extraocular muscles are intact Patient is good historian Heart is regular rate and rhythm without murmur No other changes are noted on lower extremity strength examination. She has some mild pain on extension with right lateral rotation at the low backShe does have some paraspinous muscle tenderness worse on the left side.  Assessment & Plan:   Tyree was seen today for back pain.  Diagnoses and all orders for this visit:  DDD (degenerative disc disease), lumbar  Sciatica of right  side associated with disorder of lumbar spine -     triamcinolone acetonide (KENALOG-40) injection 40 mg; 1 mL (40 mg total) by Other route once. -     sodium chloride flush (NS) 0.9 % injection 10 mL; 10 mLs by Other route once. -     ropivacaine (PF) 5 mg/mL (0.5%) (NAROPIN) injection 20 mg; 4 mLs (20 mg total) by Epidural route once. -     midazolam (VERSED) 5 MG/5ML injection 5 mg; Inject 5 mLs (5 mg total) into the vein once. -     lidocaine (PF) (XYLOCAINE) 1 % injection 5 mL; Inject 5 mLs into the skin once. -     lactated ringers infusion 1,000 mL; Inject 1,000 mLs into the vein continuous. -     iopamidol (ISOVUE-M) 41 % intrathecal injection 20 mL; 20 mLs by Other route once as needed for  contrast.     ----------------------------------------------------------------------------------------------------------------------  Problem List Items Addressed This Visit    None    Visit Diagnoses    DDD (degenerative disc disease), lumbar    -  Primary   Relevant Medications   triamcinolone acetonide (KENALOG-40) injection 40 mg (Completed)   Sciatica of right side associated with disorder of lumbar spine       Relevant Medications   triamcinolone acetonide (KENALOG-40) injection 40 mg (Completed)   sodium chloride flush (NS) 0.9 % injection 10 mL (Completed)   ropivacaine (PF) 5 mg/mL (0.5%) (NAROPIN) injection 20 mg (Completed)   midazolam (VERSED) 5 MG/5ML injection 5 mg (Completed)   lidocaine (PF) (XYLOCAINE) 1 % injection 5 mL (Completed)   lactated ringers infusion 1,000 mL   iopamidol (ISOVUE-M) 41 % intrathecal injection 20 mL      ----------------------------------------------------------------------------------------------------------------------  1. DDD (degenerative disc disease), lumbar We will proceed with her third epidural injection today. The risks and benefits once again reviewed all questions answered no guarantees were made. We'll have her return to clinic in approximately 2 months for reevaluation and continue with her current medication regimen.  2. Facet arthritis of lumbar region [facility](HCC) Based on her lumbar x-rays and the pain she experiences with extension and left lateral rotation she may be a candidate for diagnostic lumbar facet block.  3. Chronic left-sided low back pain with left-sided sciatica   ----------------------------------------------------------------------------------------------------------------------  I am having Ms. Suastegui maintain her DULoxetine, traZODone, diphenhydrAMINE, ondansetron, b complex vitamins, multivitamin with minerals, CALCIUM PO, Cholecalciferol (VITAMIN D PO), pantoprazole, sucralfate, HYDROcodone-acetaminophen,  BESIVANCE, aspirin, atorvastatin, dicyclomine, traMADol, and traMADol. We administered triamcinolone acetonide, sodium chloride flush, ropivacaine (PF) 5 mg/mL (0.5%), midazolam, lidocaine (PF), lactated ringers, and iopamidol. We will continue to administer triamcinolone acetonide, sodium chloride flush, ropivacaine (PF) 2 mg/mL (0.2%), midazolam, lidocaine (PF), lactated ringers, iopamidol, triamcinolone acetonide, sodium chloride flush, ropivacaine (PF) 2 mg/mL (0.2%), midazolam, lidocaine (PF), lactated ringers, and iopamidol.   Meds ordered this encounter  Medications  . triamcinolone acetonide (KENALOG-40) injection 40 mg  . sodium chloride flush (NS) 0.9 % injection 10 mL  . ropivacaine (PF) 5 mg/mL (0.5%) (NAROPIN) injection 20 mg  . midazolam (VERSED) 5 MG/5ML injection 5 mg  . lidocaine (PF) (XYLOCAINE) 1 % injection 5 mL  . lactated ringers infusion 1,000 mL  . iopamidol (ISOVUE-M) 41 % intrathecal injection 20 mL     Procedure: L5-S1 LESI #3 with fluoroscopic guidance and moderate sedation  NOTE: The risks, benefits, and expectations of the procedure have been discussed and explained to the patient who was understanding and in agreement with suggested  treatment plan. No guarantees were made.  DESCRIPTION OF PROCEDURE: Lumbar epidural steroid injection with IV Versed 2 mg, EKG, blood pressure, pulse, and pulse oximetry monitoring. The procedure was performed with the patient in the prone position under fluoroscopic guidance. A local anesthetic skin wheal of 1.5% plain lidocaine was performed at the appropriate site after fluoroscopic identifictation  Using strict aseptic technique, I then advanced an 18-gauge Tuohy epidural needle in the midline via loss-of-resistance to saline technique. There was negative aspiration for heme or  CSF.  I then confirmed position with both AP and Lateral fluoroscan. At L5 S1  A total of 5 mL of Preservative-Free normal saline mixed with 40 mg of Kenalog  and 1cc Ropicaine 0.2 percent was injected incrementally via the  epidurally placed needle. The needle was removed. The patient tolerated the injection well and was convalesced and discharged to home in stable condition. Should the patient have any post procedure difficulty they have been instructed on how to contact us for assistance.     Follow-up: Return for evaluation, med refill.    Yevette Edwards, MD  The [place] practitioner database for opioid medications on this patient has been reviewed by me and my staff   Greater than 50% of the total encounter time was spent in counseling and / or coordination of care.     This dictation was performed utilizing Conservation officer, historic buildings.  Please excuse any unintentional or mistaken typographical errors as a result.

## 2016-12-05 NOTE — Patient Instructions (Signed)
Pain Management Discharge Instructions  General Discharge Instructions :  If you need to reach your doctor call: Monday-Friday 8:00 am - 4:00 pm at [phone] or toll free [phone].  After clinic hours [phone] to have operator reach doctor.  Bring all of your medication bottles to all your appointments in the pain clinic.  To cancel or reschedule your appointment with Pain Management please remember to call 24 hours in advance to avoid a fee.  Refer to the educational materials which you have been given on: General Risks, I had my Procedure. Discharge Instructions, Post Sedation.  Post Procedure Instructions:  The drugs you were given will stay in your system until tomorrow, so for the next 24 hours you should not drive, make any legal decisions or drink any alcoholic beverages.  You may eat anything you prefer, but it is better to start with liquids then soups and crackers, and gradually work up to solid foods.  Please notify your doctor immediately if you have any unusual bleeding, trouble breathing or pain that is not related to your normal pain.  Depending on the type of procedure that was done, some parts of your body may feel week and/or numb.  This usually clears up by tonight or the next day.  Walk with the use of an assistive device or accompanied by an adult for the 24 hours.  You may use ice on the affected area for the first 24 hours.  Put ice in a Ziploc bag and cover with a towel and place against area 15 minutes on 15 minutes off.  You may switch to heat after 24 hours.Epidural Steroid Injection An epidural steroid injection is a shot of steroid medicine and numbing medicine that is given into the space between the spinal cord and the bones in your back (epidural space). The shot helps relieve pain caused by an irritated or swollen nerve root. The amount of pain relief you get from the injection depends on what is causing the nerve to be swollen and irritated,  and how long your pain lasts. You are more likely to benefit from this injection if your pain is strong and comes on suddenly rather than if you have had pain for a long time. Tell a health care provider about:  Any allergies you have.  All medicines you are taking, including vitamins, herbs, eye drops, creams, and over-the-counter medicines.  Any problems you or family members have had with anesthetic medicines.  Any blood disorders you have.  Any surgeries you have had.  Any medical conditions you have.  Whether you are pregnant or may be pregnant. What are the risks? Generally, this is a safe procedure. However, problems may occur, including:  Headache.  Bleeding.  Infection.  Allergic reaction to medicines.  Damage to your nerves. What happens before the procedure? Staying hydrated  Follow instructions from your health care provider about hydration, which may include:  Up to 2 hours before the procedure - you may continue to drink clear liquids, such as water, clear fruit juice, black coffee, and plain tea. Eating and drinking restrictions  Follow instructions from your health care provider about eating and drinking, which may include:  8 hours before the procedure - stop eating heavy meals or foods such as meat, fried foods, or fatty foods.  6 hours before the procedure - stop eating light meals or foods, such as toast or cereal.  6 hours before the procedure - stop drinking milk or drinks that contain milk.    2 hours before the procedure - stop drinking clear liquids. Medicine  You may be given medicines to lower anxiety.  Ask your health care provider about:  Changing or stopping your regular medicines. This is especially important if you are taking diabetes medicines or blood thinners.  Taking medicines such as aspirin and ibuprofen. These medicines can thin your blood. Do not take these medicines before your procedure if your health care provider instructs  you not to. General instructions  Plan to have someone take you home from the hospital or clinic. What happens during the procedure?  You may receive a medicine to help you relax (sedative).  You will be asked to lie on your abdomen.  The injection site will be cleaned.  A numbing medicine (local anesthetic) will be used to numb the injection site.  A needle will be inserted through your skin into the epidural space. You may feel some discomfort when this happens. An X-ray machine will be used to make sure the needle is put as close as possible to the affected nerve.  A steroid medicine and a local anesthetic will be injected into the epidural space.  The needle will be removed.  A bandage (dressing) will be put over the injection site. What happens after the procedure?  Your blood pressure, heart rate, breathing rate, and blood oxygen level will be monitored until the medicines you were given have worn off.  Your arm or leg may feel weak or numb for a few hours.  The injection site may feel sore.  Do not drive for 24 hours if you received a sedative. This information is not intended to replace advice given to you by your health care provider. Make sure you discuss any questions you have with your health care provider. Document Released: 12/27/2007 Document Revised: 03/02/2016 Document Reviewed: 01/05/2016 Elsevier Interactive Patient Education  2017 [employer]  

## 2016-12-06 ENCOUNTER — Telehealth: Payer: Self-pay | Admitting: *Deleted

## 2016-12-06 NOTE — Telephone Encounter (Signed)
Denies problems post procedure. 

## 2017-01-05 ENCOUNTER — Ambulatory Visit: Payer: Medicare Other | Admitting: Anesthesiology

## 2017-01-11 ENCOUNTER — Encounter: Payer: Self-pay | Admitting: Anesthesiology

## 2017-01-11 ENCOUNTER — Ambulatory Visit: Payer: Medicare Other | Attending: Anesthesiology | Admitting: Anesthesiology

## 2017-01-11 VITALS — BP 122/65 | HR 72 | Temp 98.0°F | Resp 18 | Ht 60.0 in | Wt 135.0 lb

## 2017-01-11 DIAGNOSIS — Z79899 Other long term (current) drug therapy: Secondary | ICD-10-CM | POA: Diagnosis not present

## 2017-01-11 DIAGNOSIS — M47816 Spondylosis without myelopathy or radiculopathy, lumbar region: Secondary | ICD-10-CM | POA: Insufficient documentation

## 2017-01-11 DIAGNOSIS — K589 Irritable bowel syndrome without diarrhea: Secondary | ICD-10-CM | POA: Insufficient documentation

## 2017-01-11 DIAGNOSIS — F419 Anxiety disorder, unspecified: Secondary | ICD-10-CM | POA: Diagnosis not present

## 2017-01-11 DIAGNOSIS — M5442 Lumbago with sciatica, left side: Secondary | ICD-10-CM | POA: Diagnosis not present

## 2017-01-11 DIAGNOSIS — G8929 Other chronic pain: Secondary | ICD-10-CM | POA: Insufficient documentation

## 2017-01-11 DIAGNOSIS — M4696 Unspecified inflammatory spondylopathy, lumbar region: Secondary | ICD-10-CM | POA: Diagnosis not present

## 2017-01-11 DIAGNOSIS — M545 Low back pain: Secondary | ICD-10-CM | POA: Diagnosis present

## 2017-01-11 DIAGNOSIS — Z7982 Long term (current) use of aspirin: Secondary | ICD-10-CM | POA: Insufficient documentation

## 2017-01-11 DIAGNOSIS — M5441 Lumbago with sciatica, right side: Secondary | ICD-10-CM | POA: Insufficient documentation

## 2017-01-11 DIAGNOSIS — K219 Gastro-esophageal reflux disease without esophagitis: Secondary | ICD-10-CM | POA: Diagnosis not present

## 2017-01-11 DIAGNOSIS — M539 Dorsopathy, unspecified: Secondary | ICD-10-CM | POA: Diagnosis not present

## 2017-01-11 DIAGNOSIS — Z87891 Personal history of nicotine dependence: Secondary | ICD-10-CM | POA: Diagnosis not present

## 2017-01-11 DIAGNOSIS — Z8673 Personal history of transient ischemic attack (TIA), and cerebral infarction without residual deficits: Secondary | ICD-10-CM | POA: Diagnosis not present

## 2017-01-11 DIAGNOSIS — M5136 Other intervertebral disc degeneration, lumbar region: Secondary | ICD-10-CM | POA: Diagnosis not present

## 2017-01-11 DIAGNOSIS — M810 Age-related osteoporosis without current pathological fracture: Secondary | ICD-10-CM | POA: Insufficient documentation

## 2017-01-11 DIAGNOSIS — M5386 Other specified dorsopathies, lumbar region: Secondary | ICD-10-CM

## 2017-01-11 NOTE — Progress Notes (Signed)
Subjective:  Patient ID: Ashley Murillo, female    DOB: 1946/07/07  Age: 71 y.o. MRN: [id number]  CC: Back Pain (low and mid)   Service Provided on Last Visit: Procedure (lesi)  Procedure: None Previous PROCEDURE: LESI #3 under fluoroscopic guidance and moderate sedation  HPI [name] presents for reevaluation. She was last seen a few months ago at which point she had her third epidural steroid injection. She states that since that time her low back pain is been 80% diminished and she is no longer experiencing any lower extremity pain. She occasionally uses Ultram for breakthrough pain. She is not doing any stretching or strengthening exercises or aerobic activity at this point. Otherwise she is in her usual state of health with no new changes in lower extremity strength function or bowel or bladder function.   By history he has a long-standing history of low back pain and left leg pain. She also has lumbar and thoracic pain. She reports having had previous epidural steroid injections by Dr. Chaney Malling. She reports good relief with the epidurals in the past giving her 60-75% improvement for approximately 2 months. This seems to get her pain under better control and allows her to continue with her baseline tramadol medication that she uses intermittently when her pain flares. The pain is described as a VAS of asked to worst 10 and averaging an 8. 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 It's worse with stooping walking uphill standing kneeling lifting and better with sleep warm shower TENS application or medication management. She takes tramadol up to twice a day when it severe and this seems to help significantly. The pain radiates into the left posterior and lateral leg with occasional give way weakness but no problems with bowel or bladder dysfunction. The pain is gradually been getting worse and it's been approximately a year or so since  she has had her last epidural. She's had a previous MRI showing evidence of an L4-5 disc bulge and lumbar x-rays revealing facet arthropathy.  History Myrl has a past medical history of Allergy; Anxiety; Arthritis; Back muscle spasm; Chronic back pain; Diverticulosis; GERD (gastroesophageal reflux disease); IBS (irritable bowel syndrome); Insomnia; Low blood monocyte count; Osteoporosis; and Stroke (HCC) (08/2016).   She has a past surgical history that includes Appendectomy; uterine tumors; Cholecystectomy; and knee cast.   Her family history includes Cancer in her father; GI problems in her mother.She reports that she has quit smoking. She has never used smokeless tobacco. She reports that she does not drink alcohol or use drugs.  Results for orders placed in visit on 01/10/05  [facility] W/O Cm   Narrative * PRIOR REPORT IMPORTED FROM AN EXTERNAL SYSTEM *   PRIOR REPORT IMPORTED FROM THE [facility] WORKFLOW SYSTEM   REASON FOR EXAM:  back pn w/RT leg pn and numbness  COMMENTS:   PROCEDURE:     MR  - MR LUMBAR SPINE WO CONTRAST  - Jan 10 2005  3:36PM   RESULT:     Multiplanar/multisequence imaging of the lumbar spine is  obtained. No evidence of disc protrusion or spinal stenosis. L4-L5 mild  annular bulge is present.   IMPRESSION:   1)Mild L4-L5 annular bulge. No evidence of significant disc protrusion or  spinal stenosis.        ToxAssure Select 13  Date Value Ref Range Status  09/23/2016 FINAL  Final    Comment:    ====================================================================  TOXASSURE SELECT 13 (MW) ==================================================================== Test                             Result       Flag       Units Drug Present and Declared for Prescription Verification   Tramadol                       PRESENT      EXPECTED   O-Desmethyltramadol            PRESENT      EXPECTED   N-Desmethyltramadol            PRESENT      EXPECTED    Source  of tramadol is a prescription medication.    O-desmethyltramadol and N-desmethyltramadol are expected    metabolites of tramadol. Drug Absent but Declared for Prescription Verification   Hydrocodone                    Not Detected UNEXPECTED ng/mg creat ==================================================================== Test                      Result    Flag   Units      Ref Range   Creatinine              41               mg/dL      >=16 ==================================================================== Declared Medications:  The flagging and interpretation on this report are based on the  following declared medications.  Unexpected results may arise from  inaccuracies in the declared medications.  **Note: The testing scope of this panel includes these medications:  Hydrocodone (Norco)  Tramadol (Ultram)  **Note: The testing scope of this panel does not include following  reported medications:  Acetaminophen (Norco)  Aspirin (Aspirin 81)  Atorvastatin (Lipitor)  Calcium  Cholecalciferol  Dicyclomine (Bentyl)  Diphenhydramine (Benadryl)  Duloxetine (Cymbalta)  Eye Drops  Multivitamin (MVI)  Ondansetron (Zofran)  Pantoprazole (Protonix)  Sucralfate (Carafate)  Trazodone (Desyrel)  Vitamin B ==================================================================== For clinical consultation, please call [phone]. ====================================================================     Outpatient Medications Prior to Visit  Medication Sig Dispense Refill  . aspirin EC 81 MG EC tablet Take 1 tablet (81 mg total) by mouth daily. 120 tablet 0  . atorvastatin (LIPITOR) 40 MG tablet Take 1 tablet (40 mg total) by mouth daily at 6 PM. 30 tablet 0  . b complex vitamins tablet Take 1 tablet by mouth daily.    Marland Kitchen BESIVANCE 0.6 % SUSP INSTILL 1 DROP both eyes at bedtime  1  . CALCIUM PO Take 1 tablet by mouth daily.    . Cholecalciferol (VITAMIN D PO) Take 1 tablet by mouth  daily.    Marland Kitchen dicyclomine (BENTYL) 20 MG tablet TAKE 1 TABLET BY MOUTH 3 TIMES A DAY 90 tablet 6  . diphenhydrAMINE (BENADRYL) 25 mg capsule Take 25 mg by mouth at bedtime as needed for allergies.     . DULoxetine (CYMBALTA) 60 MG capsule Take 60 mg by mouth daily.  5  . HYDROcodone-acetaminophen (NORCO/VICODIN) 5-325 MG per tablet Take 1-2 tablets every 6 hours as needed for severe pain 12 tablet 0  . Multiple Vitamin (MULTIVITAMIN WITH MINERALS) TABS tablet Take 1 tablet by mouth daily.    . ondansetron (ZOFRAN) 8 MG tablet Take 8 mg by mouth every  8 (eight) hours as needed for nausea or vomiting.     . pantoprazole (PROTONIX) 40 MG tablet Take 1 tablet (40 mg total) by mouth daily. 30 tablet 0  . sucralfate (CARAFATE) 1 GM/10ML suspension Take 10 mLs (1 g total) by mouth 4 (four) times daily -  with meals and at bedtime. 420 mL 0  . traMADol (ULTRAM) 50 MG tablet Take 1 tablet (50 mg total) by mouth 2 (two) times daily. (Patient taking differently: Take 50 mg by mouth 2 (two) times daily. ) 60 tablet 1  . traMADol (ULTRAM) 50 MG tablet Take 1 tablet (50 mg total) by mouth 2 (two) times daily as needed. 30 tablet 5  . traZODone (DESYREL) 100 MG tablet Take 100 mg by mouth at bedtime as needed for sleep.   11   Facility-Administered Medications Prior to Visit  Medication Dose Route Frequency Provider Last Rate Last Dose  . iopamidol (ISOVUE-M) 41 % intrathecal injection 20 mL  20 mL Other Once PRN Yevette Edwards, MD      . iopamidol (ISOVUE-M) 41 % intrathecal injection 20 mL  20 mL Other Once PRN Yevette Edwards, MD      . lactated ringers infusion 1,000 mL  1,000 mL Intravenous Continuous Yevette Edwards, MD      . lactated ringers infusion 1,000 mL  1,000 mL Intravenous Continuous Yevette Edwards, MD      . lidocaine (PF) (XYLOCAINE) 1 % injection 5 mL  5 mL Subcutaneous Once Yevette Edwards, MD      . lidocaine (PF) (XYLOCAINE) 1 % injection 5 mL  5 mL Subcutaneous Once Yevette Edwards, MD      .  midazolam (VERSED) 5 MG/5ML injection 5 mg  5 mg Intravenous Once Yevette Edwards, MD      . midazolam (VERSED) injection 5 mg  5 mg Intravenous Once Yevette Edwards, MD      . ropivacaine (PF) 2 mg/mL (0.2%) (NAROPIN) injection 10 mL  10 mL Epidural Once Yevette Edwards, MD      . ropivacaine (PF) 2 mg/mL (0.2%) (NAROPIN) injection 10 mL  10 mL Epidural Once Yevette Edwards, MD      . sodium chloride flush (NS) 0.9 % injection 10 mL  10 mL Other Once Yevette Edwards, MD      . sodium chloride flush (NS) 0.9 % injection 10 mL  10 mL Other Once Yevette Edwards, MD      . triamcinolone acetonide (KENALOG-40) injection 40 mg  40 mg Other Once Yevette Edwards, MD      . triamcinolone acetonide (KENALOG-40) injection 40 mg  40 mg Other Once Yevette Edwards, MD         --------------------------------------------------------------------------------------------------------------------- US Carotid Bilateral  Result Date: 08/13/2016 CLINICAL DATA:  TIA and syncope. EXAM: BILATERAL CAROTID DUPLEX ULTRASOUND TECHNIQUE: Wallace Cullens scale imaging, color Doppler and duplex ultrasound were performed of bilateral carotid and vertebral arteries in the neck. COMPARISON:  None. FINDINGS: Criteria: Quantification of carotid stenosis is based on velocity parameters that correlate the residual internal carotid diameter with NASCET-based stenosis levels, using the diameter of the distal internal carotid lumen as the denominator for stenosis measurement. The following velocity measurements were obtained: RIGHT ICA:  86/25 cm/sec CCA:  78/15 cm/sec SYSTOLIC ICA/CCA RATIO:  1.1 DIASTOLIC ICA/CCA RATIO:  1.7 ECA:  71 cm/sec LEFT ICA:  80/31 cm/sec CCA:  69/17 cm/sec SYSTOLIC ICA/CCA RATIO:  1.2 DIASTOLIC ICA/CCA  RATIO:  1.8 ECA:  68 cm/sec RIGHT CAROTID ARTERY: Mild common carotid and carotid bulb intimal thickening without evidence of focal plaque. No evidence of carotid stenosis. RIGHT VERTEBRAL ARTERY: Antegrade flow with normal waveform and  velocity. LEFT CAROTID ARTERY: Stable mild intimal thickening of common carotid artery and carotid bulb without focal plaque or evidence of carotid stenosis. LEFT VERTEBRAL ARTERY: Antegrade flow with normal waveform and velocity. IMPRESSION: Unremarkable carotid duplex ultrasound demonstrating no evidence of carotid stenosis in the neck bilaterally. Mild intimal thickening present without focal plaque or velocity elevation. Electronically Signed   By: Irish Lack M.D.   On: 08/13/2016 11:02       ---------------------------------------------------------------------------------------------------------------------- Past Medical History:  Diagnosis Date  . Allergy   . Anxiety   . Arthritis   . Back muscle spasm   . Chronic back pain   . Diverticulosis   . GERD (gastroesophageal reflux disease)   . IBS (irritable bowel syndrome)   . Insomnia   . Low blood monocyte count   . Osteoporosis   . Stroke [facility]) 08/2016    Past Surgical History:  Procedure Laterality Date  . APPENDECTOMY    . CHOLECYSTECTOMY    . knee cast     left 2015  . uterine tumors      Family History  Problem Relation Age of Onset  . GI problems Mother   . Cancer Father     Social History  Substance Use Topics  . Smoking status: Former Games developer  . Smokeless tobacco: Never Used  . Alcohol use No    ---------------------------------------------------------------------------------------------------------------------- Social History   Social History  . Marital status: Married    Spouse name: N/A  . Number of children: N/A  . Years of education: N/A   Social History Main Topics  . Smoking status: Former Games developer  . Smokeless tobacco: Never Used  . Alcohol use No  . Drug use: No  . Sexual activity: Not Asked   Other Topics Concern  . None   Social History Narrative   Lives at home with husband. Independent and considers herself active    Scheduled Meds: Continuous Infusions: PRN  Meds:.   BP 122/65   Pulse 72   Temp 98 F (36.7 C) (Oral)   Resp 18   Ht 5' (1.524 m)   Wt 135 lb (61.2 kg)   SpO2 99%   BMI 26.37 kg/m    BP Readings from Last 3 Encounters:  01/11/17 122/65  12/05/16 133/62  11/03/16 (!) 143/75     Wt Readings from Last 3 Encounters:  01/11/17 135 lb (61.2 kg)  12/05/16 136 lb (61.7 kg)  11/03/16 135 lb (61.2 kg)     ----------------------------------------------------------------------------------------------------------------------  ROS Review of Systems  Cardiac: Daily aspirin no angina no shortness of breath or orthopnea GI: Rare constipationWith no other changes noted once again  Objective:  BP 122/65   Pulse 72   Temp 98 F (36.7 C) (Oral)   Resp 18   Ht 5' (1.524 m)   Wt 135 lb (61.2 kg)   SpO2 99%   BMI 26.37 kg/m   Physical Exam Pupils are equally round reactive to light extraocular muscles are intact Patient is good historian Heart is regular rate and rhythm without murmur No other changes are noted on lower extremity strength examination. She has some mild pain on extension with right lateral rotation at the low backShe does have some paraspinous muscle tenderness worse on the left side. No overt trigger  points are located in the paraspinous lumbar region.  Assessment & Plan:   Alycen was seen today for back pain.  Diagnoses and all orders for this visit:  DDD (degenerative disc disease), lumbar  Sciatica of right side associated with disorder of lumbar spine  Facet arthritis of lumbar region [facility])     ----------------------------------------------------------------------------------------------------------------------  Problem List Items Addressed This Visit    None    Visit Diagnoses    DDD (degenerative disc disease), lumbar    -  Primary   Sciatica of right side associated with disorder of lumbar spine       Facet arthritis of lumbar region (HCC)           ----------------------------------------------------------------------------------------------------------------------  1. DDD (degenerative disc disease), lumbar I've encouraged her to continue with back stretching strengthening exercises as described in detail today. She has Ultram for now to take for breakthrough pain and can't take anti-inflammatories periodically for pain management as well. We will have her return to clinic in 3 months for reevaluation. At that point we may consider repeat series of epidural steroids if necessary. She will call to notify us should she have any recurrence of her radicular pain.  2. Facet arthritis of lumbar region [facility]) Based on her lumbar x-rays and the pain she experiences with extension and left lateral rotation she may be a candidate for diagnostic lumbar facet block.  3. Chronic left-sided low back pain with left-sided sciatica   ----------------------------------------------------------------------------------------------------------------------  I am having Ms. Hirata maintain her DULoxetine, traZODone, diphenhydrAMINE, ondansetron, b complex vitamins, multivitamin with minerals, CALCIUM PO, Cholecalciferol (VITAMIN D PO), pantoprazole, sucralfate, HYDROcodone-acetaminophen, BESIVANCE, aspirin, atorvastatin, dicyclomine, traMADol, and traMADol. We will continue to administer triamcinolone acetonide, sodium chloride flush, ropivacaine (PF) 2 mg/mL (0.2%), midazolam, lidocaine (PF), lactated ringers, iopamidol, triamcinolone acetonide, sodium chloride flush, ropivacaine (PF) 2 mg/mL (0.2%), midazolam, lidocaine (PF), lactated ringers, and iopamidol.   No orders of the defined types were placed in this encounter.    Follow-up: Return in about 3 months (around 04/12/2017) for procedure prn lesi.    Yevette Edwards, MD  The [place] practitioner database for opioid medications on this patient has been reviewed by me and my staff   Greater than 50% of the  total encounter time was spent in counseling and / or coordination of care.     This dictation was performed utilizing Conservation officer, historic buildings.  Please excuse any unintentional or mistaken typographical errors as a result.

## 2017-01-11 NOTE — Progress Notes (Signed)
Safety precautions to be maintained throughout the outpatient stay will include: orient to surroundings, keep bed in low position, maintain call bell within reach at all times, provide assistance with transfer out of bed and ambulation.  

## 2017-03-27 ENCOUNTER — Other Ambulatory Visit: Payer: Self-pay | Admitting: Anesthesiology

## 2017-03-27 ENCOUNTER — Ambulatory Visit
Admission: RE | Admit: 2017-03-27 | Discharge: 2017-03-27 | Disposition: A | Discharge status: Home / Self Care | Payer: Medicare Other | Source: Ambulatory Visit | Attending: Anesthesiology | Admitting: Anesthesiology

## 2017-03-27 ENCOUNTER — Encounter: Payer: Self-pay | Admitting: Anesthesiology

## 2017-03-27 ENCOUNTER — Ambulatory Visit (HOSPITAL_BASED_OUTPATIENT_CLINIC_OR_DEPARTMENT_OTHER): Payer: Medicare Other | Admitting: Anesthesiology

## 2017-03-27 VITALS — BP 170/86 | HR 72 | Temp 98.0°F | Resp 18 | Ht 60.0 in | Wt 135.0 lb

## 2017-03-27 DIAGNOSIS — M4696 Unspecified inflammatory spondylopathy, lumbar region: Secondary | ICD-10-CM | POA: Diagnosis not present

## 2017-03-27 DIAGNOSIS — M5136 Other intervertebral disc degeneration, lumbar region: Secondary | ICD-10-CM

## 2017-03-27 DIAGNOSIS — M539 Dorsopathy, unspecified: Secondary | ICD-10-CM | POA: Diagnosis not present

## 2017-03-27 DIAGNOSIS — M5386 Other specified dorsopathies, lumbar region: Secondary | ICD-10-CM

## 2017-03-27 DIAGNOSIS — R52 Pain, unspecified: Secondary | ICD-10-CM

## 2017-03-27 DIAGNOSIS — M47816 Spondylosis without myelopathy or radiculopathy, lumbar region: Secondary | ICD-10-CM

## 2017-03-27 MED ORDER — ROPIVACAINE HCL 2 MG/ML IJ SOLN
10.0000 mL | Freq: Once | INTRAMUSCULAR | Status: AC
  Administered 2017-03-27: 10 mL via EPIDURAL

## 2017-03-27 MED ORDER — SODIUM CHLORIDE 0.9% FLUSH
10.0000 mL | Freq: Once | INTRAVENOUS | Status: AC
  Administered 2017-03-27: 10 mL

## 2017-03-27 MED ORDER — TRAMADOL HCL 50 MG PO TABS: 50.0000 mg | ORAL_TABLET | Freq: Two times a day (BID) | ORAL | 1 refills | Status: DC

## 2017-03-27 MED ORDER — TRIAMCINOLONE ACETONIDE 40 MG/ML IJ SUSP
40.0000 mg | Freq: Once | INTRAMUSCULAR | Status: AC
  Administered 2017-03-27: 40 mg

## 2017-03-27 MED ORDER — IOPAMIDOL (ISOVUE-M 200) INJECTION 41%
20.0000 mL | Freq: Once | INTRAMUSCULAR | Status: DC | PRN
  Administered 2017-03-27: 10 mL
  Filled 2017-03-27: qty 20

## 2017-03-27 MED ORDER — IOPAMIDOL (ISOVUE-M 200) INJECTION 41%
INTRAMUSCULAR | Status: AC
  Filled 2017-03-27: qty 10

## 2017-03-27 MED ORDER — SODIUM CHLORIDE 0.9 % IJ SOLN
INTRAMUSCULAR | Status: AC
  Filled 2017-03-27: qty 10

## 2017-03-27 MED ORDER — TRIAMCINOLONE ACETONIDE 40 MG/ML IJ SUSP
INTRAMUSCULAR | Status: AC
  Filled 2017-03-27: qty 1

## 2017-03-27 MED ORDER — LIDOCAINE HCL (PF) 1 % IJ SOLN
5.0000 mL | Freq: Once | INTRAMUSCULAR | Status: AC
  Administered 2017-03-27: 5 mL via SUBCUTANEOUS

## 2017-03-27 MED ORDER — ROPIVACAINE HCL 2 MG/ML IJ SOLN
INTRAMUSCULAR | Status: AC
  Filled 2017-03-27: qty 10

## 2017-03-27 MED ORDER — LIDOCAINE HCL (PF) 1 % IJ SOLN
INTRAMUSCULAR | Status: AC
  Filled 2017-03-27: qty 5

## 2017-03-27 NOTE — Progress Notes (Signed)
Safety precautions to be maintained throughout the outpatient stay will include: orient to surroundings, keep bed in low position, maintain call bell within reach at all times, provide assistance with transfer out of bed and ambulation.  

## 2017-03-28 ENCOUNTER — Telehealth: Payer: Self-pay

## 2017-03-28 NOTE — Progress Notes (Signed)
Subjective:  Patient ID: Ashley Murillo, female    DOB: 05/03/1946  Age: 71 y.o. MRN: [id number]  CC: Back Pain (low) and Leg Pain (bilateral and posterior)   Service Provided on Last Visit: Med Refill, Evaluation  Procedure: Lumbar epidural steroid at L5 S1 under fluoroscopic guidance without sedation Previous PROCEDURE: LESI #3 under fluoroscopic guidance and moderate sedation  HPI Ashley Murillo presents for reevaluation. She was last seen in April. She is having more pain in the low back with radiation into the posterior calves and legs. She previously responded to epidural steroid injections and did well with these. Unfortunately she has had some recurrence of pain. The pain is worse with prolonged standing and certain activities. Medication management has not been sufficient to keep her pain under good control and she has been quite miserable. Age in bowel or bladder function or lower extremity strength or function is noted at this time. She does use some tramadol for breakthrough pain and tries to do her stretching exercises with limited success.  By history he has a long-standing history of low back pain and left leg pain. She also has lumbar and thoracic pain. She reports having had previous epidural steroid injections by Dr. Chaney Malling. She reports good relief with the epidurals in the past giving her 60-75% improvement for approximately 2 months. This seems to get her pain under better control and allows her to continue with her baseline tramadol medication that she uses intermittently when her pain flares. The pain is described as a VAS of asked to worst 10 and averaging an 8. 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 It's worse with stooping walking uphill standing kneeling lifting and better with sleep warm shower TENS application or medication management. She takes tramadol up to twice a day when it severe and this seems to help  significantly. The pain radiates into the left posterior and lateral leg with occasional give way weakness but no problems with bowel or bladder dysfunction. The pain is gradually been getting worse and it's been approximately a year or so since she has had her last epidural. She's had a previous MRI showing evidence of an L4-5 disc bulge and lumbar x-rays revealing facet arthropathy.  History Ashley Murillo has a past medical history of Allergy; Anxiety; Arthritis; Back muscle spasm; Chronic back pain; Diverticulosis; GERD (gastroesophageal reflux disease); IBS (irritable bowel syndrome); Insomnia; Low blood monocyte count; Osteoporosis; and Stroke (HCC) (08/2016).   She has a past surgical history that includes Appendectomy; uterine tumors; Cholecystectomy; and knee cast.   Her family history includes Cancer in her father; GI problems in her mother.She reports that she has quit smoking. She has never used smokeless tobacco. She reports that she does not drink alcohol or use drugs.  Results for orders placed in visit on 01/10/05  [facility] W/O Cm   Narrative * PRIOR REPORT IMPORTED FROM AN EXTERNAL SYSTEM *   PRIOR REPORT IMPORTED FROM THE [facility] WORKFLOW SYSTEM   REASON FOR EXAM:  back pn w/RT leg pn and numbness  COMMENTS:   PROCEDURE:     MR  - MR LUMBAR SPINE WO CONTRAST  - Jan 10 2005  3:36PM   RESULT:     Multiplanar/multisequence imaging of the lumbar spine is  obtained. No evidence of disc protrusion or spinal stenosis. L4-L5 mild  annular bulge is present.   IMPRESSION:   1)Mild L4-L5 annular bulge. No evidence of significant disc  protrusion or  spinal stenosis.        Tox[employer] Select 13  Date Value Ref Range Status  09/23/2016 FINAL  Final    Comment:    ==================================================================== TOXASSURE SELECT 13 (MW) ==================================================================== Test                             Result       Flag        Units Drug Present and Declared for Prescription Verification   Tramadol                       PRESENT      EXPECTED   O-Desmethyltramadol            PRESENT      EXPECTED   N-Desmethyltramadol            PRESENT      EXPECTED    Source of tramadol is a prescription medication.    O-desmethyltramadol and N-desmethyltramadol are expected    metabolites of tramadol. Drug Absent but Declared for Prescription Verification   Hydrocodone                    Not Detected UNEXPECTED ng/mg creat ==================================================================== Test                      Result    Flag   Units      Ref Range   Creatinine              41               mg/dL      >=16 ==================================================================== Declared Medications:  The flagging and interpretation on this report are based on the  following declared medications.  Unexpected results may arise from  inaccuracies in the declared medications.  **Note: The testing scope of this panel includes these medications:  Hydrocodone (Norco)  Tramadol (Ultram)  **Note: The testing scope of this panel does not include following  reported medications:  Acetaminophen (Norco)  Aspirin (Aspirin 81)  Atorvastatin (Lipitor)  Calcium  Cholecalciferol  Dicyclomine (Bentyl)  Diphenhydramine (Benadryl)  Duloxetine (Cymbalta)  Eye Drops  Multivitamin (MVI)  Ondansetron (Zofran)  Pantoprazole (Protonix)  Sucralfate (Carafate)  Trazodone (Desyrel)  Vitamin B ==================================================================== For clinical consultation, please call [phone]. ====================================================================     Outpatient Medications Prior to Visit  Medication Sig Dispense Refill  . aspirin EC 81 MG EC tablet Take 1 tablet (81 mg total) by mouth daily. 120 tablet 0  . atorvastatin (LIPITOR) 40 MG tablet Take 1 tablet (40 mg total) by mouth daily at 6 PM. 30  tablet 0  . b complex vitamins tablet Take 1 tablet by mouth daily.    Marland Kitchen BESIVANCE 0.6 % SUSP INSTILL 1 DROP both eyes at bedtime  1  . CALCIUM PO Take 1 tablet by mouth daily.    . Cholecalciferol (VITAMIN D PO) Take 1 tablet by mouth daily.    Marland Kitchen dicyclomine (BENTYL) 20 MG tablet TAKE 1 TABLET BY MOUTH 3 TIMES A DAY 90 tablet 6  . diphenhydrAMINE (BENADRYL) 25 mg capsule Take 25 mg by mouth at bedtime as needed for allergies.     . DULoxetine (CYMBALTA) 60 MG capsule Take 60 mg by mouth daily.  5  . HYDROcodone-acetaminophen (NORCO/VICODIN) 5-325 MG per tablet Take 1-2 tablets every 6 hours as needed for severe  pain 12 tablet 0  . Multiple Vitamin (MULTIVITAMIN WITH MINERALS) TABS tablet Take 1 tablet by mouth daily.    . ondansetron (ZOFRAN) 8 MG tablet Take 8 mg by mouth every 8 (eight) hours as needed for nausea or vomiting.     . pantoprazole (PROTONIX) 40 MG tablet Take 1 tablet (40 mg total) by mouth daily. 30 tablet 0  . sucralfate (CARAFATE) 1 GM/10ML suspension Take 10 mLs (1 g total) by mouth 4 (four) times daily -  with meals and at bedtime. 420 mL 0  . traMADol (ULTRAM) 50 MG tablet Take 1 tablet (50 mg total) by mouth 2 (two) times daily as needed. 30 tablet 5  . traZODone (DESYREL) 100 MG tablet Take 100 mg by mouth at bedtime as needed for sleep.   11  . traMADol (ULTRAM) 50 MG tablet Take 1 tablet (50 mg total) by mouth 2 (two) times daily. (Patient taking differently: Take 50 mg by mouth 2 (two) times daily. ) 60 tablet 1   Facility-Administered Medications Prior to Visit  Medication Dose Route Frequency Provider Last Rate Last Dose  . iopamidol (ISOVUE-M) 41 % intrathecal injection 20 mL  20 mL Other Once PRN Yevette Edwards, MD      . iopamidol (ISOVUE-M) 41 % intrathecal injection 20 mL  20 mL Other Once PRN Yevette Edwards, MD      . lactated ringers infusion 1,000 mL  1,000 mL Intravenous Continuous Yevette Edwards, MD      . lactated ringers infusion 1,000 mL  1,000 mL  Intravenous Continuous Yevette Edwards, MD      . lidocaine (PF) (XYLOCAINE) 1 % injection 5 mL  5 mL Subcutaneous Once Yevette Edwards, MD      . lidocaine (PF) (XYLOCAINE) 1 % injection 5 mL  5 mL Subcutaneous Once Yevette Edwards, MD      . midazolam (VERSED) 5 MG/5ML injection 5 mg  5 mg Intravenous Once Yevette Edwards, MD      . midazolam (VERSED) injection 5 mg  5 mg Intravenous Once Yevette Edwards, MD      . ropivacaine (PF) 2 mg/mL (0.2%) (NAROPIN) injection 10 mL  10 mL Epidural Once Yevette Edwards, MD      . ropivacaine (PF) 2 mg/mL (0.2%) (NAROPIN) injection 10 mL  10 mL Epidural Once Yevette Edwards, MD      . sodium chloride flush (NS) 0.9 % injection 10 mL  10 mL Other Once Yevette Edwards, MD      . sodium chloride flush (NS) 0.9 % injection 10 mL  10 mL Other Once Yevette Edwards, MD      . triamcinolone acetonide (KENALOG-40) injection 40 mg  40 mg Other Once Yevette Edwards, MD      . triamcinolone acetonide (KENALOG-40) injection 40 mg  40 mg Other Once Yevette Edwards, MD         --------------------------------------------------------------------------------------------------------------------- US Carotid Bilateral  Result Date: 08/13/2016 CLINICAL DATA:  TIA and syncope. EXAM: BILATERAL CAROTID DUPLEX ULTRASOUND TECHNIQUE: Wallace Cullens scale imaging, color Doppler and duplex ultrasound were performed of bilateral carotid and vertebral arteries in the neck. COMPARISON:  None. FINDINGS: Criteria: Quantification of carotid stenosis is based on velocity parameters that correlate the residual internal carotid diameter with NASCET-based stenosis levels, using the diameter of the distal internal carotid lumen as the denominator for stenosis measurement. The following velocity measurements were obtained: RIGHT ICA:  86/25 cm/sec  CCA:  78/15 cm/sec SYSTOLIC ICA/CCA RATIO:  1.1 DIASTOLIC ICA/CCA RATIO:  1.7 ECA:  71 cm/sec LEFT ICA:  80/31 cm/sec CCA:  69/17 cm/sec SYSTOLIC ICA/CCA RATIO:  1.2  DIASTOLIC ICA/CCA RATIO:  1.8 ECA:  68 cm/sec RIGHT CAROTID ARTERY: Mild common carotid and carotid bulb intimal thickening without evidence of focal plaque. No evidence of carotid stenosis. RIGHT VERTEBRAL ARTERY: Antegrade flow with normal waveform and velocity. LEFT CAROTID ARTERY: Stable mild intimal thickening of common carotid artery and carotid bulb without focal plaque or evidence of carotid stenosis. LEFT VERTEBRAL ARTERY: Antegrade flow with normal waveform and velocity. IMPRESSION: Unremarkable carotid duplex ultrasound demonstrating no evidence of carotid stenosis in the neck bilaterally. Mild intimal thickening present without focal plaque or velocity elevation. Electronically Signed   By: Irish Lack M.D.   On: 08/13/2016 11:02       ---------------------------------------------------------------------------------------------------------------------- Past Medical History:  Diagnosis Date  . Allergy   . Anxiety   . Arthritis   . Back muscle spasm   . Chronic back pain   . Diverticulosis   . GERD (gastroesophageal reflux disease)   . IBS (irritable bowel syndrome)   . Insomnia   . Low blood monocyte count   . Osteoporosis   . Stroke [facility]) 08/2016    Past Surgical History:  Procedure Laterality Date  . APPENDECTOMY    . CHOLECYSTECTOMY    . knee cast     left 2015  . uterine tumors      Family History  Problem Relation Age of Onset  . GI problems Mother   . Cancer Father     Social History  Substance Use Topics  . Smoking status: Former Games developer  . Smokeless tobacco: Never Used  . Alcohol use No    ---------------------------------------------------------------------------------------------------------------------- Social History   Social History  . Marital status: Married    Spouse name: N/A  . Number of children: N/A  . Years of education: N/A   Social History Main Topics  . Smoking status: Former Games developer  . Smokeless tobacco: Never Used  .  Alcohol use No  . Drug use: No  . Sexual activity: Not Asked   Other Topics Concern  . None   Social History Narrative   Lives at home with husband. Independent and considers herself active    BP (!) 170/86   Pulse 72   Temp 98 F (36.7 C) (Oral)   Resp 18   Ht 5' (1.524 m)   Wt 135 lb (61.2 kg)   SpO2 99%   BMI 26.37 kg/m    BP Readings from Last 3 Encounters:  03/27/17 (!) 170/86  01/11/17 122/65  12/05/16 133/62     Wt Readings from Last 3 Encounters:  03/27/17 135 lb (61.2 kg)  01/11/17 135 lb (61.2 kg)  12/05/16 136 lb (61.7 kg)     ----------------------------------------------------------------------------------------------------------------------  ROS Review of Systems  Cardiac: Daily aspirin no angina no shortness of breath or orthopnea GI: Rare constipationWith no other changes noted once again  Objective:  BP (!) 170/86   Pulse 72   Temp 98 F (36.7 C) (Oral)   Resp 18   Ht 5' (1.524 m)   Wt 135 lb (61.2 kg)   SpO2 99%   BMI 26.37 kg/m   Physical Exam Pupils are equally round reactive to light extraocular muscles are intact Patient is good historian Heart is regular rate and rhythm without murmur No other changes are noted on lower extremity strength examination. She  has some mild pain on extension with right lateral rotation at the low backShe does have some paraspinous muscle tenderness worse on the left side. No overt trigger points are located in the paraspinous lumbar region.  Assessment & Plan:   Ashley Murillo was seen today for back pain and leg pain.  Diagnoses and all orders for this visit:  DDD (degenerative disc disease), lumbar -     triamcinolone acetonide (KENALOG-40) injection 40 mg; 1 mL (40 mg total) by Other route once. -     sodium chloride flush (NS) 0.9 % injection 10 mL; 10 mLs by Other route once. -     ropivacaine (PF) 2 mg/mL (0.2%) (NAROPIN) injection 10 mL; 10 mLs by Epidural route once. -     lidocaine (PF)  (XYLOCAINE) 1 % injection 5 mL; Inject 5 mLs into the skin once. -     iopamidol (ISOVUE-M) 41 % intrathecal injection 20 mL; 20 mLs by Other route once as needed for contrast.  Sciatica of right side associated with disorder of lumbar spine  Facet arthritis of lumbar region (HCC)  Other orders -     traMADol (ULTRAM) 50 MG tablet; Take 1 tablet (50 mg total) by mouth 2 (two) times daily.     ----------------------------------------------------------------------------------------------------------------------  Problem List Items Addressed This Visit    None    Visit Diagnoses    DDD (degenerative disc disease), lumbar    -  Primary   Relevant Medications   triamcinolone acetonide (KENALOG-40) injection 40 mg (Completed)   sodium chloride flush (NS) 0.9 % injection 10 mL (Completed)   ropivacaine (PF) 2 mg/mL (0.2%) (NAROPIN) injection 10 mL (Completed)   lidocaine (PF) (XYLOCAINE) 1 % injection 5 mL (Completed)   iopamidol (ISOVUE-M) 41 % intrathecal injection 20 mL   traMADol (ULTRAM) 50 MG tablet   Sciatica of right side associated with disorder of lumbar spine       Facet arthritis of lumbar region (HCC)       Relevant Medications   triamcinolone acetonide (KENALOG-40) injection 40 mg (Completed)   traMADol (ULTRAM) 50 MG tablet      ----------------------------------------------------------------------------------------------------------------------  1. DDD (degenerative disc disease), lumbar I've encouraged her to continue with back stretching strengthening exercises as described in detail today. She has Ultram for now to take for breakthrough pain and can't take anti-inflammatories periodically for pain management as well. Based on the severity of her pain and her lack of sleep should like to go ahead with an epidural today. She is responded favorably to these in the past and I think that this would be reasonable. We talked about the risks and benefits of epidural steroid  injections. She would like to pursue this today. We will have her return to clinic in approximately 2-3 months. I have refilled her Ultram as well. 2. Facet arthritis of lumbar region [facility](HCC) Based on her lumbar x-rays and the pain she experiences with extension and left lateral rotation she may be a candidate for diagnostic lumbar facet block.  3. Chronic left-sided low back pain with left-sided sciatica   ----------------------------------------------------------------------------------------------------------------------  I am having Ms. Casados maintain her DULoxetine, traZODone, diphenhydrAMINE, ondansetron, b complex vitamins, multivitamin with minerals, CALCIUM PO, Cholecalciferol (VITAMIN D PO), pantoprazole, sucralfate, HYDROcodone-acetaminophen, BESIVANCE, aspirin, atorvastatin, dicyclomine, traMADol, and traMADol. We administered triamcinolone acetonide, sodium chloride flush, ropivacaine (PF) 2 mg/mL (0.2%), lidocaine (PF), and iopamidol. We will continue to administer triamcinolone acetonide, sodium chloride flush, ropivacaine (PF) 2 mg/mL (0.2%), midazolam, lidocaine (PF), lactated ringers,  iopamidol, triamcinolone acetonide, sodium chloride flush, ropivacaine (PF) 2 mg/mL (0.2%), midazolam, lidocaine (PF), lactated ringers, and iopamidol.   Meds ordered this encounter  Medications  . triamcinolone acetonide (KENALOG-40) injection 40 mg  . sodium chloride flush (NS) 0.9 % injection 10 mL  . ropivacaine (PF) 2 mg/mL (0.2%) (NAROPIN) injection 10 mL  . lidocaine (PF) (XYLOCAINE) 1 % injection 5 mL  . iopamidol (ISOVUE-M) 41 % intrathecal injection 20 mL  . traMADol (ULTRAM) 50 MG tablet    Sig: Take 1 tablet (50 mg total) by mouth 2 (two) times daily.    Dispense:  60 tablet    Refill:  1   Procedure: Lumbar epidural steroid under fluoroscopic guidance with out sedation   Procedure: L5-S1 LESI with fluoroscopic guidance and moderate sedation  NOTE: The risks, benefits, and  expectations of the procedure have been discussed and explained to the patient who was understanding and in agreement with suggested treatment plan. No guarantees were made.  DESCRIPTION OF PROCEDURE: Lumbar epidural steroid injection with no IV Versed, EKG, blood pressure, pulse, and pulse oximetry monitoring. The procedure was performed with the patient in the prone position under fluoroscopic guidance. I injected subcutaneous lidocaine overlying the L5-S1 site after its fluoroscopic identifictation.  Using strict aseptic technique, I then advanced an 18-gauge Tuohy epidural needle in the midline using interlaminar approach via loss-of-resistance to saline technique. There was negative aspiration for heme or  CSF.  I then confirmed position with both AP and Lateral fluoroscan. 2 cc of Isovue were injected and a  total of 5 mL of Preservative-Free normal saline mixed with 40 mg of Kenalog and 1cc Ropicaine 0.2 percent were injected incrementally via the  epidurally placed needle. The needle was removed. The patient tolerated the injection well and was convalesced and discharged to home in stable condition. Should the patient have any post procedure difficulty they have been instructed on how to contact us for assistance.    Follow-up: Return in about 3 months (around 06/27/2017) for evaluation, med refill.    Yevette Edwards, MD  The [place] practitioner database for opioid medications on this patient has been reviewed by me and my staff   Greater than 50% of the total encounter time was spent in counseling and / or coordination of care.     This dictation was performed utilizing Conservation officer, historic buildings.  Please excuse any unintentional or mistaken typographical errors as a result.

## 2017-03-28 NOTE — Telephone Encounter (Signed)
Post procedure phone call.  Patient states she is doing OK 

## 2017-06-06 ENCOUNTER — Encounter: Payer: Self-pay | Admitting: Emergency Medicine

## 2017-06-06 ENCOUNTER — Emergency Department
Admission: EM | Admit: 2017-06-06 | Discharge: 2017-06-06 | Disposition: A | Discharge status: Home / Self Care | Payer: Medicare Other | Attending: Emergency Medicine | Admitting: Emergency Medicine

## 2017-06-06 DIAGNOSIS — R110 Nausea: Secondary | ICD-10-CM | POA: Insufficient documentation

## 2017-06-06 DIAGNOSIS — R55 Syncope and collapse: Secondary | ICD-10-CM

## 2017-06-06 DIAGNOSIS — Z79899 Other long term (current) drug therapy: Secondary | ICD-10-CM | POA: Insufficient documentation

## 2017-06-06 DIAGNOSIS — R531 Weakness: Secondary | ICD-10-CM | POA: Insufficient documentation

## 2017-06-06 DIAGNOSIS — Z7982 Long term (current) use of aspirin: Secondary | ICD-10-CM | POA: Insufficient documentation

## 2017-06-06 DIAGNOSIS — R42 Dizziness and giddiness: Secondary | ICD-10-CM | POA: Diagnosis present

## 2017-06-06 DIAGNOSIS — Z87891 Personal history of nicotine dependence: Secondary | ICD-10-CM | POA: Diagnosis not present

## 2017-06-06 LAB — CBC
HEMATOCRIT: 41.8 % (ref 35.0–47.0)
HEMOGLOBIN: 14.2 g/dL (ref 12.0–16.0)
MCH: 30.9 pg (ref 26.0–34.0)
MCHC: 33.9 g/dL (ref 32.0–36.0)
MCV: 91.2 fL (ref 80.0–100.0)
Platelets: 236 10*3/uL (ref 150–440)
RBC: 4.58 MIL/uL (ref 3.80–5.20)
RDW: 14.4 % (ref 11.5–14.5)
WBC: 6.2 10*3/uL (ref 3.6–11.0)

## 2017-06-06 LAB — URINALYSIS, COMPLETE (UACMP) WITH MICROSCOPIC
BACTERIA UA: NONE SEEN
BILIRUBIN URINE: NEGATIVE
Glucose, UA: NEGATIVE mg/dL
KETONES UR: NEGATIVE mg/dL
LEUKOCYTES UA: NEGATIVE
NITRITE: NEGATIVE
PH: 6 (ref 5.0–8.0)
PROTEIN: NEGATIVE mg/dL
SQUAMOUS EPITHELIAL / LPF: NONE SEEN
Specific Gravity, Urine: 1.002 — ABNORMAL LOW (ref 1.005–1.030)

## 2017-06-06 LAB — HEPATIC FUNCTION PANEL
ALT: 13 U/L — AB (ref 14–54)
AST: 21 U/L (ref 15–41)
Albumin: 4.4 g/dL (ref 3.5–5.0)
Alkaline Phosphatase: 32 U/L — ABNORMAL LOW (ref 38–126)
BILIRUBIN DIRECT: 0.1 mg/dL (ref 0.1–0.5)
BILIRUBIN INDIRECT: 0.7 mg/dL (ref 0.3–0.9)
Total Bilirubin: 0.8 mg/dL (ref 0.3–1.2)
Total Protein: 7.4 g/dL (ref 6.5–8.1)

## 2017-06-06 LAB — BASIC METABOLIC PANEL
ANION GAP: 10 (ref 5–15)
BUN: 12 mg/dL (ref 6–20)
CHLORIDE: 101 mmol/L (ref 101–111)
CO2: 25 mmol/L (ref 22–32)
Calcium: 9.4 mg/dL (ref 8.9–10.3)
Creatinine, Ser: 0.83 mg/dL (ref 0.44–1.00)
GFR calc Af Amer: >60 mL/min (ref >60)
Glucose, Bld: 104 mg/dL — ABNORMAL HIGH (ref 65–99)
POTASSIUM: 3.6 mmol/L (ref 3.5–5.1)
SODIUM: 136 mmol/L (ref 135–145)

## 2017-06-06 LAB — LIPASE, BLOOD: LIPASE: 21 U/L (ref 11–51)

## 2017-06-06 LAB — GLUCOSE, CAPILLARY: Glucose-Capillary: 85 mg/dL (ref 65–99)

## 2017-06-06 LAB — TROPONIN I

## 2017-06-06 MED ORDER — ONDANSETRON HCL 4 MG/2ML IJ SOLN
4.0000 mg | Freq: Once | INTRAMUSCULAR | Status: AC
  Administered 2017-06-06: 4 mg via INTRAVENOUS

## 2017-06-06 MED ORDER — SODIUM CHLORIDE 0.9 % IV BOLUS (SEPSIS)
1000.0000 mL | Freq: Once | INTRAVENOUS | Status: AC
  Administered 2017-06-06: 1000 mL via INTRAVENOUS

## 2017-06-06 MED ORDER — ONDANSETRON HCL 4 MG/2ML IJ SOLN
INTRAMUSCULAR | Status: AC
  Filled 2017-06-06: qty 2

## 2017-06-06 NOTE — ED Triage Notes (Addendum)
Pt to ED via EMS from home with c/o dizziness and weakness x2days, hx of stroke. Per EMS cbg 100. Pt A&Ox4, VS stbale. NIH neg

## 2017-06-06 NOTE — ED Provider Notes (Signed)
[facility] Emergency Department Provider Note ____________________________________________   First MD Initiated Contact with Patient 06/06/17 1309     (approximate)  I have reviewed the triage vital signs and the nursing notes.   HISTORY  Chief Complaint Weakness    HPI Ashley Murillo is a 71 y.o. female Presents with dizziness acute onset since this morning, described as feeling like she is about to pass out and associated with 2 episodes of near syncope. Patient denies associated vertigo or spinning. She reports associated nausea but no vomiting, denies headache, chest pain or difficulty breathing, abdominal pain, diarrhea, fever or urinary symptoms.  She states the symptoms are worse when she tries to stand up or walk. no prior history of episodes like this. She states that in the last several days she's been feeling more fatigued than normal and spending more time in bed.   Past Medical History:  Diagnosis Date  . Allergy   . Anxiety   . Arthritis   . Back muscle spasm   . Chronic back pain   . Diverticulosis   . GERD (gastroesophageal reflux disease)   . IBS (irritable bowel syndrome)   . Insomnia   . Low blood monocyte count   . Osteoporosis   . Stroke [facility]) 08/2016    Patient Active Problem List   Diagnosis Date Noted  . TIA (transient ischemic attack) 08/12/2016  . Back muscle spasm 02/23/2015  . Thoracic spondylosis with radiculopathy 02/23/2015  . 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 Anxiety state 04/21/2014  . Chronic pain 04/21/2014  . Acid reflux 04/21/2014  . Cannot sleep 04/21/2014  . OP (osteoporosis) 04/21/2014    Past Surgical History:  Procedure Laterality Date  . APPENDECTOMY    . CHOLECYSTECTOMY    . knee cast     left 2015  . uterine tumors      Prior to Admission medications   Medication Sig Start Date End Date Taking? Authorizing Provider  aspirin EC 81 MG EC tablet Take 1 tablet (81 mg total) by  mouth daily. 08/13/16  Yes Adrian Saran, MD  BESIVANCE 0.6 % SUSP INSTILL 1 DROP both eyes at bedtime 03/19/15  Yes [provider]  DULoxetine (CYMBALTA) 60 MG capsule Take 60 mg by mouth daily. 11/17/14  Yes [provider]  LORazepam (ATIVAN) 1 MG tablet Take 1 tablet by mouth 2 (two) times daily as needed. 04/06/17  Yes [provider]  pantoprazole (PROTONIX) 40 MG tablet Take 1 tablet (40 mg total) by mouth daily. 12/20/14  Yes Gerhard Munch, MD  traZODone (DESYREL) 100 MG tablet Take 100 mg by mouth at bedtime as needed for sleep.  12/01/14  Yes [provider]  atorvastatin (LIPITOR) 40 MG tablet Take 1 tablet (40 mg total) by mouth daily at 6 PM. Patient not taking: Reported on 06/06/2017 08/13/16   Adrian Saran, MD  dicyclomine (BENTYL) 20 MG tablet TAKE 1 TABLET BY MOUTH 3 TIMES A DAY 09/09/16   Midge Minium, MD  diphenhydrAMINE (BENADRYL) 25 mg capsule Take 25 mg by mouth at bedtime as needed for allergies.     [provider]  HYDROcodone-acetaminophen (NORCO/VICODIN) 5-325 MG per tablet Take 1-2 tablets every 6 hours as needed for severe pain Patient not taking: Reported on 06/06/2017 12/22/14   Renne Crigler, PA-C  sucralfate (CARAFATE) 1 GM/10ML suspension Take 10 mLs (1 g total) by mouth 4 (four) times daily -  with meals and at bedtime. Patient not taking: Reported on 06/06/2017  12/20/14   Gerhard MunchLockwood, Robert, MD  traMADol (ULTRAM) 50 MG tablet Take 1 tablet (50 mg total) by mouth 2 (two) times daily as needed. 09/23/16   Yevette EdwardsAdams, James G, MD    Allergies Sulfa antibiotics; Tape; and Latex  Family History  Problem Relation Age of Onset  . GI problems Mother   . Cancer Father     Social History Social History  Substance Use Topics  . Smoking status: Former Games developermoker  . Smokeless tobacco: Never Used  . Alcohol use No    Review of Systems  Constitutional: No fever/chills.  Positive for generalized weakness. Eyes: No visual changes. ENT: No  sore throat. Cardiovascular: Denies chest pain. Respiratory: Denies shortness of breath. Gastrointestinal: Positive for nausea, no vomiting.  No diarrhea.  Genitourinary: Negative for dysuria.  Musculoskeletal: Negative for back pain. Skin: Negative for rash. Neurological: Negative for headaches, focal weakness or numbness.  No change in or difficulty with speech.   ____________________________________________   PHYSICAL EXAM:  VITAL SIGNS: ED Triage Vitals  Enc Vitals Group     BP 06/06/17 1255 (!) 165/86     Pulse Rate 06/06/17 1255 68     Resp 06/06/17 1255 16     Temp 06/06/17 1255 98 F (36.7 C)     Temp Source 06/06/17 1255 Oral     SpO2 06/06/17 1255 100 %     Weight 06/06/17 1255 140 lb (63.5 kg)     Height 06/06/17 1255 5' (1.524 m)     Head Circumference --      Peak Flow --      Pain Score 06/06/17 1254 0     Pain Loc --      Pain Edu? --      Excl. in GC? --     Constitutional: Alert and oriented. Relatively well appearing and in no acute distress. Eyes: Conjunctivae are normal. EOMI.  PERRLA.  Head: Atraumatic. Nose: No congestion/rhinnorhea. Mouth/Throat: Mucous membranes are slightly dry.  Neck: Normal range of motion.  Cardiovascular: Normal rate, regular rhythm. Grossly normal heart sounds.  Good peripheral circulation. Respiratory: Normal respiratory effort.  No retractions. Lungs CTAB. Gastrointestinal: Soft and nontender. No distention.  Genitourinary: No CVA tenderness. Musculoskeletal: No lower extremity edema.  Extremities warm and well perfused.  Neurologic:  Normal speech and language. No gross focal neurologic deficits are appreciated. Motor and sensory intact in all extremities, normal coordination. CNs III through XII intact.  Skin:  Skin is warm and dry. No rash noted. Psychiatric: Mood and affect are normal. Speech and behavior are normal.  ____________________________________________   LABS (all labs ordered are listed, but only  abnormal results are displayed)  Labs Reviewed  BASIC METABOLIC PANEL - Abnormal; Notable for the following:       Result Value   Glucose, Bld 104 (*)    All other components within normal limits  URINALYSIS, COMPLETE (UACMP) WITH MICROSCOPIC - Abnormal; Notable for the following:    Color, Urine COLORLESS (*)    APPearance CLEAR (*)    Specific Gravity, Urine 1.002 (*)    Hgb urine dipstick SMALL (*)    All other components within normal limits  HEPATIC FUNCTION PANEL - Abnormal; Notable for the following:    ALT 13 (*)    Alkaline Phosphatase 32 (*)    All other components within normal limits  CBC  GLUCOSE, CAPILLARY  TROPONIN I  LIPASE, BLOOD   ____________________________________________  EKG  ED ECG REPORT I, Dionne BucySebastian Shaundrea Carrigg, the  attending physician, personally viewed and interpreted this ECG.  Date: 06/06/2017 EKG Time: 1255 Rate: 23 Rhythm: normal sinus rhythm QRS Axis: normal Intervals: normal ST/T Wave abnormalities: no acute findings Narrative Interpretation: no evidence of acute ischemia  ____________________________________________  RADIOLOGY    ____________________________________________   PROCEDURES  Procedure(s) performed: No    Critical Care performed: No ____________________________________________   INITIAL IMPRESSION / ASSESSMENT AND PLAN / ED COURSE  Pertinent labs & imaging results that were available during my care of the patient were reviewed by me and considered in my medical decision making (see chart for details).  71 year old female with past history of TIA and other PMH as noted present with acute onset of lightheadedness and near syncope since this morning, and generalized fatigue for the last several days. No other focal symptoms. On exam, patient slightly fatigued but relatively well-appearing, vital signs are normal, and no other focal findings. Neuro exam is nonfocal. Differential includes anemia, dehydration or other  metabolic cause, infection, less likely cardiac. Given no focal neuro findings, no e/o CNS cause.  Plan for basic labs, troponin, UA and chest x-ray, fluid bolus, and reassess.    ----------------------------------------- 5:04 PM on 06/06/2017 -----------------------------------------  Patient's workup is unremarkable. The UA is negative, basic labs are within normal limits and troponin is negative.  patient gives additional history and states that she actually had full syncope twice earlier today but states that it was all within the same episode of dizziness. She states that she had the prodrome of lightheadedness and feeling weak before the syncope happened. She and family members state she has also been under increased recent stress from her work.  Patient states that she feels significantly better and would like to go home now. She is sitting up in the bed and states she feels relatively well. Overall given the description of the syncope this is most consistent with a vasovagal episode with possibly some underlying dehydration or other benign cause. given normal EKG and negative troponin as well as patient's improved symptoms and otherwise negative workup there is no indication for further observation in the ED or admission.  Patient states she actually has follow-up tomorrow to have labs drawn by her PMD and has an appointment for next week. Patient instructed to maintain this follow-up plan, and I gave her return precautions and instructed patient to return immediately if she had recurrent significant lightheadedness, syncope, or any other new or worsening symptoms that concern her.  ____________________________________________   FINAL CLINICAL IMPRESSION(S) / ED DIAGNOSES  Final diagnoses:  Syncope, unspecified syncope type  Generalized weakness      NEW MEDICATIONS STARTED DURING THIS VISIT:  Discharge Medication List as of 06/06/2017  5:10 PM       Note:  This document was  prepared using Dragon voice recognition software and may include unintentional dictation errors.    Dionne Bucy, MD 06/06/17 2119

## 2017-06-06 NOTE — Discharge Instructions (Signed)
Return to the ER immediately for new, worsening, or persistent lightheadedness, generalized weakness, chest pain, difficulty breathing, any speech or vision disturbance, any other neurologic symptoms such as weakness or numbness, or any other new or worsening symptoms that concern you. Follow-up tomorrow for your blood work and with your primary care doctor next week as discussed.

## 2017-06-06 NOTE — ED Notes (Signed)
Pt ambulatory to bedside commode with one assist.

## 2017-06-06 NOTE — ED Notes (Signed)
MD made aware of pt symptoms and protocols, no new orders given at this time

## 2017-06-06 NOTE — ED Notes (Signed)
ED Provider at bedside. 

## 2018-04-05 IMAGING — US US CAROTID DUPLEX BILAT
1 series · 13 of 24 positions shown · non-contrast
Comparison: None.

CLINICAL DATA: TIA and syncope.

EXAM:
BILATERAL CAROTID DUPLEX ULTRASOUND
TECHNIQUE: Gray scale imaging, color Doppler and duplex ultrasound were
performed of bilateral carotid and vertebral arteries in the neck.

[Series 1: us carotid duplex bilat · 0.05mm/px · 13 of 64 slices shown]
[im 1/64]
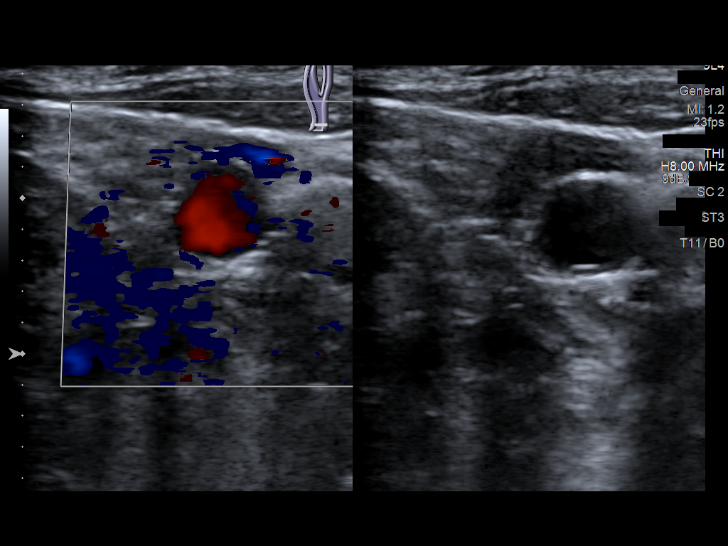
[im 6/64]
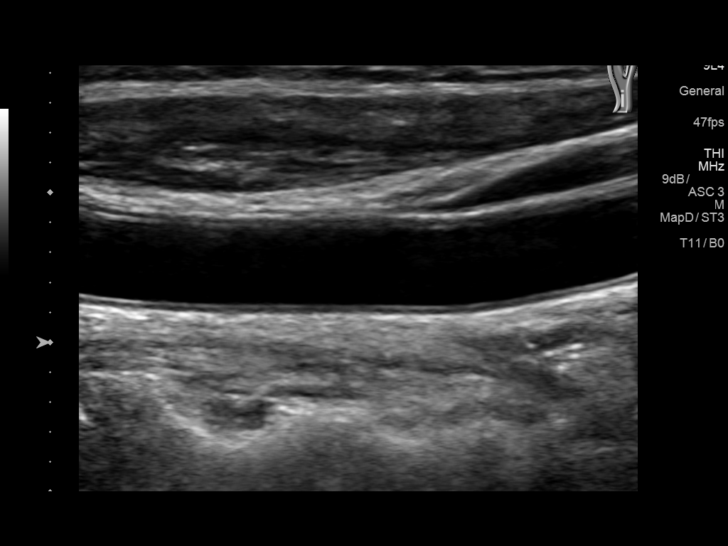
[im 11/64]
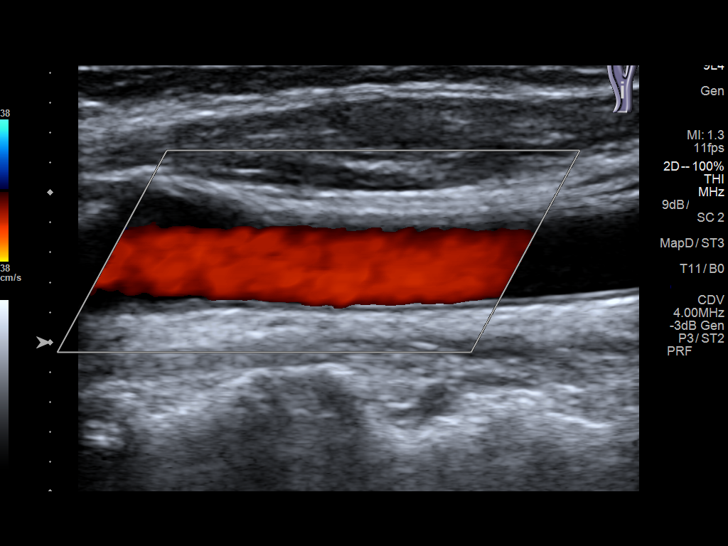
[im 17/64]
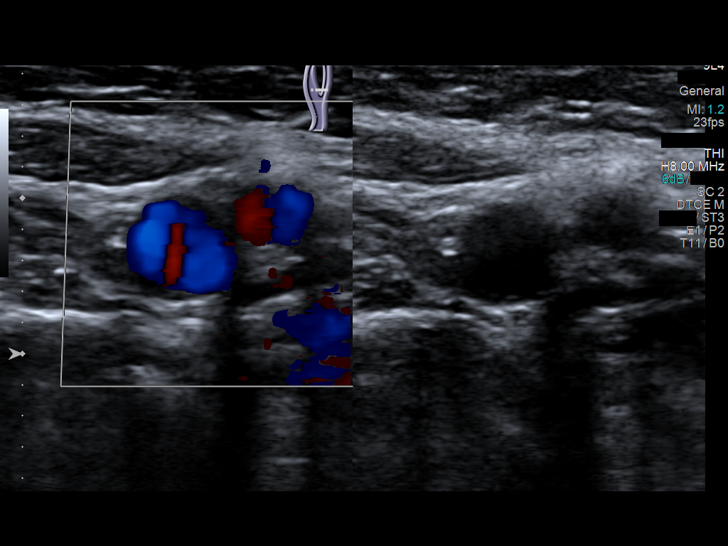
[im 22/64]
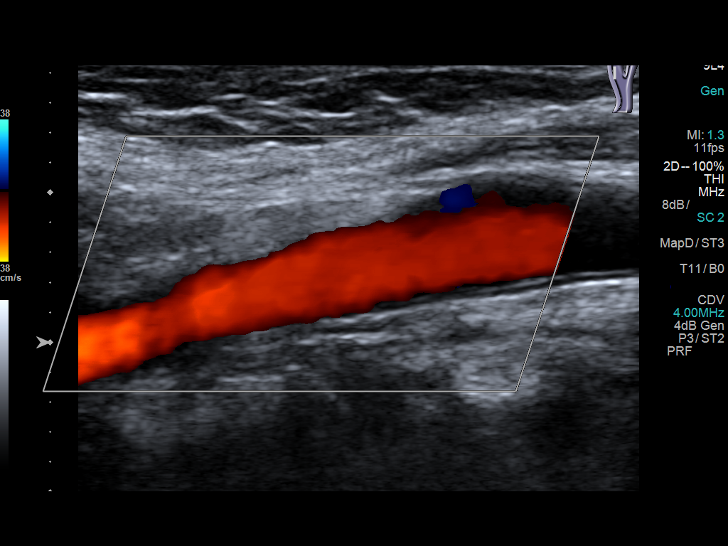
[im 28/64]
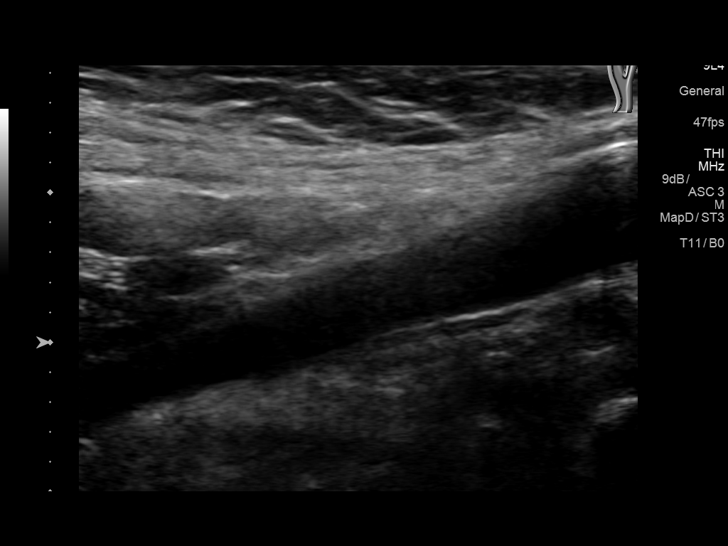
[im 33/64]
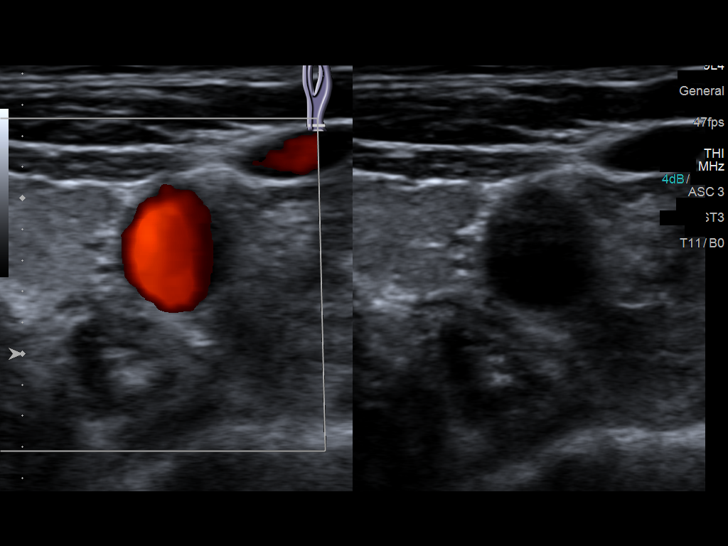
[im 36/64]
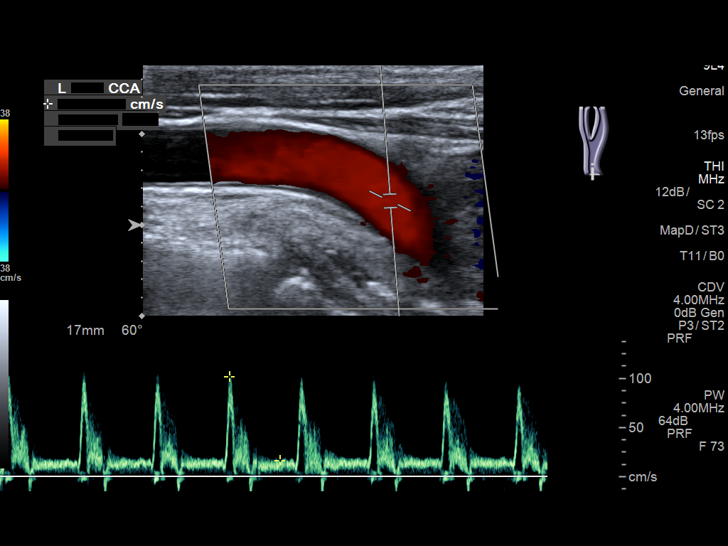
[im 42/64]
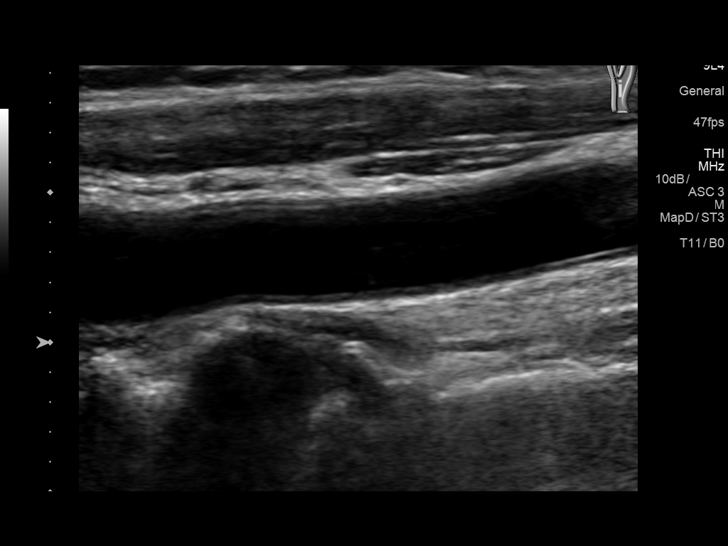
[im 47/64]
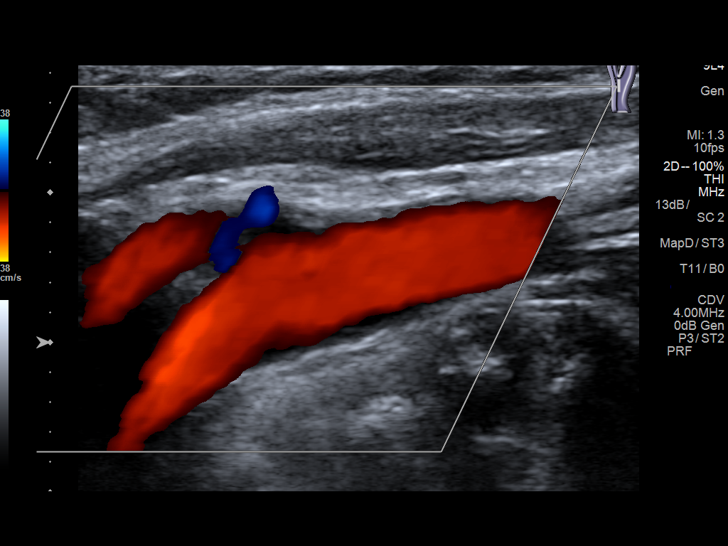
[im 53/64]
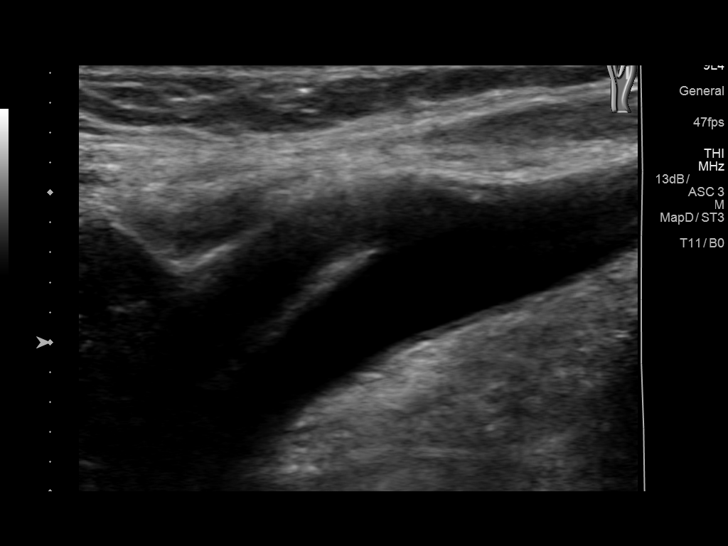
[im 58/64]
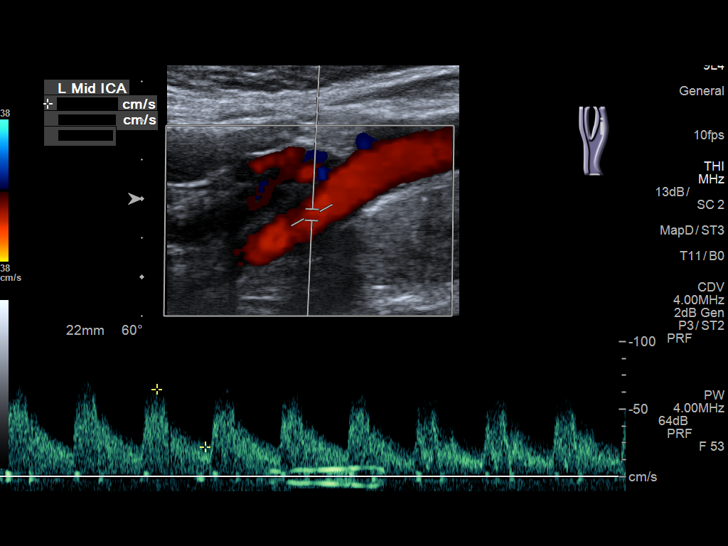
[im 64/64]
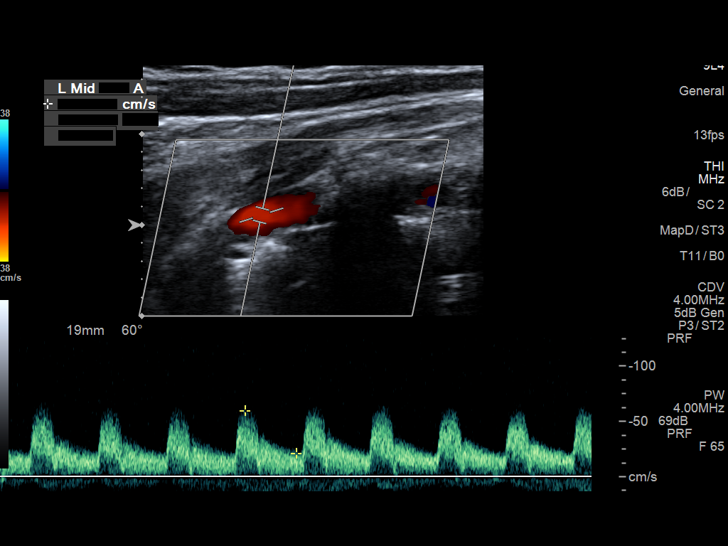

[13 of 24 positions shown; findings below may reference images not displayed]

FINDINGS: Criteria: Quantification of carotid stenosis is based on velocity
parameters that correlate the residual internal carotid diameter
with NASCET-based stenosis levels, using the diameter of the distal
internal carotid lumen as the denominator for stenosis measurement.

The following velocity measurements were obtained:

RIGHT

ICA:  86/25 cm/sec

CCA:  78/15 cm/sec

SYSTOLIC ICA/CCA RATIO:

DIASTOLIC ICA/CCA RATIO:

ECA:  71 cm/sec

LEFT

ICA:  80/31 cm/sec

CCA:  69/17 cm/sec

SYSTOLIC ICA/CCA RATIO:

DIASTOLIC ICA/CCA RATIO:

ECA:  68 cm/sec

RIGHT CAROTID ARTERY: Mild common carotid and carotid bulb intimal
thickening without evidence of focal plaque. No evidence of carotid
stenosis.

RIGHT VERTEBRAL ARTERY: Antegrade flow with normal waveform and
velocity.

LEFT CAROTID ARTERY: Stable mild intimal thickening of common
carotid artery and carotid bulb without focal plaque or evidence of
carotid stenosis.

LEFT VERTEBRAL ARTERY: Antegrade flow with normal waveform and
velocity.
IMPRESSION: Unremarkable carotid duplex ultrasound demonstrating no evidence of
carotid stenosis in the neck bilaterally. Mild intimal thickening
present without focal plaque or velocity elevation.

## 2018-05-07 ENCOUNTER — Encounter: Payer: Self-pay | Admitting: Anesthesiology

## 2018-05-07 ENCOUNTER — Other Ambulatory Visit: Payer: Self-pay | Admitting: Anesthesiology

## 2018-05-07 ENCOUNTER — Ambulatory Visit
Admission: RE | Admit: 2018-05-07 | Discharge: 2018-05-07 | Disposition: A | Discharge status: Home / Self Care | Payer: Medicare Other | Source: Ambulatory Visit | Attending: Anesthesiology | Admitting: Anesthesiology

## 2018-05-07 ENCOUNTER — Ambulatory Visit (HOSPITAL_BASED_OUTPATIENT_CLINIC_OR_DEPARTMENT_OTHER): Payer: Medicare Other | Admitting: Anesthesiology

## 2018-05-07 VITALS — BP 164/82 | HR 67 | Temp 98.4°F | Resp 13 | Ht 60.0 in | Wt 128.0 lb

## 2018-05-07 DIAGNOSIS — R52 Pain, unspecified: Secondary | ICD-10-CM

## 2018-05-07 DIAGNOSIS — M47816 Spondylosis without myelopathy or radiculopathy, lumbar region: Secondary | ICD-10-CM | POA: Diagnosis not present

## 2018-05-07 DIAGNOSIS — M5136 Other intervertebral disc degeneration, lumbar region: Secondary | ICD-10-CM | POA: Diagnosis not present

## 2018-05-07 DIAGNOSIS — M5442 Lumbago with sciatica, left side: Secondary | ICD-10-CM

## 2018-05-07 DIAGNOSIS — G8929 Other chronic pain: Secondary | ICD-10-CM | POA: Insufficient documentation

## 2018-05-07 DIAGNOSIS — Z7982 Long term (current) use of aspirin: Secondary | ICD-10-CM | POA: Diagnosis not present

## 2018-05-07 DIAGNOSIS — Z79899 Other long term (current) drug therapy: Secondary | ICD-10-CM | POA: Insufficient documentation

## 2018-05-07 MED ORDER — TRAMADOL HCL 50 MG PO TABS: 50.0000 mg | ORAL_TABLET | Freq: Two times a day (BID) | ORAL | 5 refills | Status: AC | PRN

## 2018-05-07 MED ORDER — TRIAMCINOLONE ACETONIDE 40 MG/ML IJ SUSP
40.0000 mg | Freq: Once | INTRAMUSCULAR | Status: AC
  Administered 2018-05-07: 40 mg
  Filled 2018-05-07: qty 1

## 2018-05-07 MED ORDER — SODIUM CHLORIDE 0.9 % IJ SOLN
INTRAMUSCULAR | Status: AC
  Filled 2018-05-07: qty 10

## 2018-05-07 MED ORDER — LIDOCAINE HCL (PF) 1 % IJ SOLN
5.0000 mL | Freq: Once | INTRAMUSCULAR | Status: AC
  Administered 2018-05-07: 5 mL via SUBCUTANEOUS

## 2018-05-07 MED ORDER — IOPAMIDOL (ISOVUE-M 200) INJECTION 41%
INTRAMUSCULAR | Status: AC
  Filled 2018-05-07: qty 10

## 2018-05-07 MED ORDER — LIDOCAINE HCL (PF) 1 % IJ SOLN
INTRAMUSCULAR | Status: AC
  Filled 2018-05-07: qty 10

## 2018-05-07 MED ORDER — SODIUM CHLORIDE 0.9% FLUSH
10.0000 mL | Freq: Once | INTRAVENOUS | Status: AC
  Administered 2018-05-07: 10 mL

## 2018-05-07 MED ORDER — ROPIVACAINE HCL 2 MG/ML IJ SOLN
10.0000 mL | Freq: Once | INTRAMUSCULAR | Status: AC
  Administered 2018-05-07: 10 mL via EPIDURAL

## 2018-05-07 MED ORDER — IOPAMIDOL (ISOVUE-M 200) INJECTION 41%
20.0000 mL | Freq: Once | INTRAMUSCULAR | Status: DC | PRN
  Administered 2018-05-07: 10 mL
  Filled 2018-05-07: qty 20

## 2018-05-07 NOTE — Progress Notes (Signed)
Safety precautions to be maintained throughout the outpatient stay will include: orient to surroundings, keep bed in low position, maintain call bell within reach at all times, provide assistance with transfer out of bed and ambulation.  

## 2018-05-07 NOTE — Progress Notes (Signed)
Subjective:  Patient ID: Ashley Murillo, female    DOB: 1946/08/07  Age: 72 y.o. MRN: [id number]  CC: Back Pain (lower left is worse )   Procedure: L5-S1 epidural steroid without sedation  HPI Ashley Murillo presents for reevaluation.  She was last seen approximately a year ago and has had a series of previous epidural steroid injections.  This is for predominantly left greater than right lower extremity pain with radiating pain into the calf buttock and lower legs.  She has had recurrence of the same quality pain that she had previously reported.  Her last injections back in 2018 gave her nearly 1 year of significant improvement in her low back pain however over the past few weeks she has had recurrence prompting her to return to clinic for repeat injection today.  No change in lower extremity strength or function or bowel bladder function is noted at this time.  She is requiring intermittent medication for pain relief but otherwise no opiates at this time.  Outpatient Medications Prior to Visit  Medication Sig Dispense Refill  . aspirin EC 81 MG EC tablet Take 1 tablet (81 mg total) by mouth daily. 120 tablet 0  . atorvastatin (LIPITOR) 20 MG tablet Take 10 mg by mouth daily.    Marland Kitchen BESIVANCE 0.6 % SUSP INSTILL 1 DROP both eyes at bedtime  1  . dicyclomine (BENTYL) 20 MG tablet TAKE 1 TABLET BY MOUTH 3 TIMES A DAY 90 tablet 6  . diphenhydrAMINE (BENADRYL) 25 mg capsule Take 25 mg by mouth at bedtime as needed for allergies.     . DULoxetine (CYMBALTA) 60 MG capsule Take 60 mg by mouth daily.  5  . LORazepam (ATIVAN) 1 MG tablet Take 1 tablet by mouth 2 (two) times daily as needed.    . pantoprazole (PROTONIX) 40 MG tablet Take 1 tablet (40 mg total) by mouth daily. 30 tablet 0  . traZODone (DESYREL) 100 MG tablet Take 100 mg by mouth at bedtime as needed for sleep.   11  . traMADol (ULTRAM) 50 MG tablet Take 1 tablet (50 mg total) by mouth 2 (two) times daily as needed. 30 tablet 5  .  atorvastatin (LIPITOR) 40 MG tablet Take 1 tablet (40 mg total) by mouth daily at 6 PM. (Patient not taking: Reported on 06/06/2017) 30 tablet 0  . Cholecalciferol (VITAMIN D-1000 MAX ST) 1000 units tablet Take 1,000 mg by mouth daily.    Marland Kitchen HYDROcodone-acetaminophen (NORCO/VICODIN) 5-325 MG per tablet Take 1-2 tablets every 6 hours as needed for severe pain (Patient not taking: Reported on 06/06/2017) 12 tablet 0  . latanoprost (XALATAN) 0.005 % ophthalmic solution Place 1 drop into both eyes at bedtime.    . sucralfate (CARAFATE) 1 GM/10ML suspension Take 10 mLs (1 g total) by mouth 4 (four) times daily -  with meals and at bedtime. (Patient not taking: Reported on 06/06/2017) 420 mL 0  . zoledronic acid (RECLAST) 5 MG/100ML SOLN injection Inject 5 mg into the vein as needed. Once per year     Facility-Administered Medications Prior to Visit  Medication Dose Route Frequency Provider Last Rate Last Dose  . iopamidol (ISOVUE-M) 41 % intrathecal injection 20 mL  20 mL Other Once PRN Yevette Edwards, MD      . iopamidol (ISOVUE-M) 41 % intrathecal injection 20 mL  20 mL Other Once PRN Yevette Edwards, MD      . lactated ringers infusion 1,000 mL  1,000 mL Intravenous Continuous  Yevette EdwardsAdams, James G, MD      . lactated ringers infusion 1,000 mL  1,000 mL Intravenous Continuous Yevette EdwardsAdams, James G, MD      . lidocaine (PF) (XYLOCAINE) 1 % injection 5 mL  5 mL Subcutaneous Once Yevette EdwardsAdams, James G, MD      . lidocaine (PF) (XYLOCAINE) 1 % injection 5 mL  5 mL Subcutaneous Once Yevette EdwardsAdams, James G, MD      . midazolam (VERSED) 5 MG/5ML injection 5 mg  5 mg Intravenous Once Yevette EdwardsAdams, James G, MD      . midazolam (VERSED) injection 5 mg  5 mg Intravenous Once Yevette EdwardsAdams, James G, MD      . ropivacaine (PF) 2 mg/mL (0.2%) (NAROPIN) injection 10 mL  10 mL Epidural Once Yevette EdwardsAdams, James G, MD      . ropivacaine (PF) 2 mg/mL (0.2%) (NAROPIN) injection 10 mL  10 mL Epidural Once Yevette EdwardsAdams, James G, MD      . sodium chloride flush (NS) 0.9 % injection 10  mL  10 mL Other Once Yevette EdwardsAdams, James G, MD      . sodium chloride flush (NS) 0.9 % injection 10 mL  10 mL Other Once Yevette EdwardsAdams, James G, MD      . triamcinolone acetonide (KENALOG-40) injection 40 mg  40 mg Other Once Yevette EdwardsAdams, James G, MD      . triamcinolone acetonide (KENALOG-40) injection 40 mg  40 mg Other Once Yevette EdwardsAdams, James G, MD        Review of Systems CNS: No confusion or sedation Cardiac: No angina or palpitations GI: No abdominal pain or constipation Constitutional: No nausea vomiting fevers or chills  Objective:  BP (!) 161/84   Pulse 67   Temp 98.4 F (36.9 C) (Oral)   Resp 12   Ht 5' (1.524 m)   Wt 128 lb (58.1 kg)   SpO2 100%   BMI 25.00 kg/m    BP Readings from Last 3 Encounters:  05/07/18 (!) 161/84  06/06/17 (!) 144/86  03/27/17 (!) 170/86     Wt Readings from Last 3 Encounters:  05/07/18 128 lb (58.1 kg)  06/06/17 140 lb (63.5 kg)  03/27/17 135 lb (61.2 kg)     Physical Exam Pt is alert and oriented PERRL EOMI HEART IS RRR no murmur or rub LCTA no wheezing or rales MUSCULOSKELETAL reveals some paraspinous muscle tenderness but no overt trigger points.  She does have a positive straight leg raise on the left side negative on the right and her muscle tone and bulk to the lower extremities is intact.  She walks with a mildly antalgic gait  Labs  Lab Results  Component Value Date   HGBA1C  01/05/2009    5.5 (NOTE)   The ADA recommends the following therapeutic goal for glycemic   control related to Hgb A1C measurement:   Goal of Therapy:   < 7.0% Hgb A1C   Reference: American Diabetes Association: Clinical Practice   Recommendations 2008, Diabetes Care,  2008, 31:(Suppl 1).   Lab Results  Component Value Date   LDLCALC 127 (H) 08/12/2016   CREATININE 0.83 06/06/2017     --------------------------------------------------------------------------------------------------------------------   --------------------------------------------------------------------------------------------------------------------- No results found.   Assessment & Plan:   Ashley Murillo was seen today for back pain.  Diagnoses and all orders for this visit:  DDD (degenerative disc disease), lumbar  Chronic left-sided low back pain with left-sided sciatica  Facet arthritis of lumbar region  Other orders -     traMADol Janean Sark(ULTRAM)  50 MG tablet; Take 1 tablet (50 mg total) by mouth 2 (two) times daily as needed. -     triamcinolone acetonide (KENALOG-40) injection 40 mg -     sodium chloride flush (NS) 0.9 % injection 10 mL -     ropivacaine (PF) 2 mg/mL (0.2%) (NAROPIN) injection 10 mL -     lidocaine (PF) (XYLOCAINE) 1 % injection 5 mL -     iopamidol (ISOVUE-M) 41 % intrathecal injection 20 mL        ----------------------------------------------------------------------------------------------------------------------  Problem List Items Addressed This Visit    None    Visit Diagnoses    DDD (degenerative disc disease), lumbar    -  Primary   Relevant Medications   traMADol (ULTRAM) 50 MG tablet   triamcinolone acetonide (KENALOG-40) injection 40 mg (Completed)   Chronic left-sided low back pain with left-sided sciatica       Relevant Medications   traMADol (ULTRAM) 50 MG tablet   triamcinolone acetonide (KENALOG-40) injection 40 mg (Completed)   Facet arthritis of lumbar region       Relevant Medications   traMADol (ULTRAM) 50 MG tablet   triamcinolone acetonide (KENALOG-40) injection 40 mg (Completed)        ----------------------------------------------------------------------------------------------------------------------  1. DDD (degenerative disc disease), lumbar We will proceed with a repeat epidural injection today at L5-S1 as reviewed with her.   All the risks and benefits of been reviewed and questions answered.  We will have her return to clinic in 1 month after this for possible repeat injection if indicated at that time.  2. Chronic left-sided low back pain with left-sided sciatica Continue back stretching strengthening exercises as reviewed with her once again today.  3. Facet arthritis of lumbar region As above    ----------------------------------------------------------------------------------------------------------------------  I am having Ashley Murillo maintain her DULoxetine, traZODone, diphenhydrAMINE, pantoprazole, sucralfate, HYDROcodone-acetaminophen, BESIVANCE, aspirin, atorvastatin, dicyclomine, LORazepam, Cholecalciferol, latanoprost, zoledronic acid, atorvastatin, and traMADol. We administered triamcinolone acetonide, sodium chloride flush, ropivacaine (PF) 2 mg/mL (0.2%), lidocaine (PF), and iopamidol. We will continue to administer triamcinolone acetonide, sodium chloride flush, ropivacaine (PF) 2 mg/mL (0.2%), midazolam, lidocaine (PF), lactated ringers, iopamidol, triamcinolone acetonide, sodium chloride flush, ropivacaine (PF) 2 mg/mL (0.2%), midazolam, lidocaine (PF), lactated ringers, and iopamidol.   Meds ordered this encounter  Medications  . traMADol (ULTRAM) 50 MG tablet    Sig: Take 1 tablet (50 mg total) by mouth 2 (two) times daily as needed.    Dispense:  30 tablet    Refill:  5  . triamcinolone acetonide (KENALOG-40) injection 40 mg  . sodium chloride flush (NS) 0.9 % injection 10 mL  . ropivacaine (PF) 2 mg/mL (0.2%) (NAROPIN) injection 10 mL  . lidocaine (PF) (XYLOCAINE) 1 % injection 5 mL  . iopamidol (ISOVUE-M) 41 % intrathecal injection 20 mL   Patient's Medications  New Prescriptions   No medications on file  Previous Medications   ASPIRIN EC 81 MG EC TABLET    Take 1 tablet (81 mg total) by mouth daily.   ATORVASTATIN (LIPITOR) 20 MG TABLET    Take 10 mg by mouth daily.    ATORVASTATIN (LIPITOR) 40 MG TABLET    Take 1 tablet (40 mg total) by mouth daily at 6 PM.   BESIVANCE 0.6 % SUSP    INSTILL 1 DROP both eyes at bedtime   CHOLECALCIFEROL (VITAMIN D-1000 MAX ST) 1000 UNITS TABLET    Take 1,000 mg by mouth daily.   DICYCLOMINE (BENTYL) 20 MG TABLET  TAKE 1 TABLET BY MOUTH 3 TIMES A DAY   DIPHENHYDRAMINE (BENADRYL) 25 MG CAPSULE    Take 25 mg by mouth at bedtime as needed for allergies.    DULOXETINE (CYMBALTA) 60 MG CAPSULE    Take 60 mg by mouth daily.   HYDROCODONE-ACETAMINOPHEN (NORCO/VICODIN) 5-325 MG PER TABLET    Take 1-2 tablets every 6 hours as needed for severe pain   LATANOPROST (XALATAN) 0.005 % OPHTHALMIC SOLUTION    Place 1 drop into both eyes at bedtime.   LORAZEPAM (ATIVAN) 1 MG TABLET    Take 1 tablet by mouth 2 (two) times daily as needed.   PANTOPRAZOLE (PROTONIX) 40 MG TABLET    Take 1 tablet (40 mg total) by mouth daily.   SUCRALFATE (CARAFATE) 1 GM/10ML SUSPENSION    Take 10 mLs (1 g total) by mouth 4 (four) times daily -  with meals and at bedtime.   TRAZODONE (DESYREL) 100 MG TABLET    Take 100 mg by mouth at bedtime as needed for sleep.    ZOLEDRONIC ACID (RECLAST) 5 MG/100ML SOLN INJECTION    Inject 5 mg into the vein as needed. Once per year  Modified Medications   Modified Medication Previous Medication   TRAMADOL (ULTRAM) 50 MG TABLET traMADol (ULTRAM) 50 MG tablet      Take 1 tablet (50 mg total) by mouth 2 (two) times daily as needed.    Take 1 tablet (50 mg total) by mouth 2 (two) times daily as needed.  Discontinued Medications   No medications on file   ----------------------------------------------------------------------------------------------------------------------  Follow-up: Return for evaluation, procedure.   Procedure: L5 S1 LESI with fluoroscopic guidance and no moderate sedation  NOTE: The risks, benefits, and expectations of the procedure have been discussed and explained to the patient who was understanding and  in agreement with suggested treatment plan. No guarantees were made.  DESCRIPTION OF PROCEDURE: Lumbar epidural steroid injection with no IV Versed, EKG, blood pressure, pulse, and pulse oximetry monitoring. The procedure was performed with the patient in the prone position under fluoroscopic guidance.  Sterile prep x3 was initiated and I then injected subcutaneous lidocaine to the overlying 5 S1 site after its fluoroscopic identifictation.  Using strict aseptic technique, I then advanced an 18-gauge Tuohy epidural needle in the midline using interlaminar approach via loss-of-resistance to saline technique. There was negative aspiration for heme or  CSF.  I then confirmed position with both AP and Lateral fluoroscan.  2 cc of Isovue were injected and a  total of 5 mL of Preservative-Free normal saline mixed with 40 mg of Kenalog and 1cc Ropicaine 0.2 percent were injected incrementally via the  epidurally placed needle. The needle was removed. The patient tolerated the injection well and was convalesced and discharged to home in stable condition. Should the patient have any post procedure difficulty they have been instructed on how to contact us for assistance.    Yevette Edwards, MD

## 2018-05-08 ENCOUNTER — Telehealth: Payer: Self-pay

## 2018-05-08 NOTE — Telephone Encounter (Signed)
Denies any needs at this time. Instructed to call if needed. 

## 2018-06-11 ENCOUNTER — Ambulatory Visit: Payer: Medicare Other | Admitting: Anesthesiology

## 2018-10-17 IMAGING — CT CT HEAD CODE STROKE
3 series · 15 of 44 positions shown, 18 images · non-contrast
Comparison: CT head 10/27/2013

CLINICAL DATA: Code stroke. Slurred speech. Confusion and
dizziness.

EXAM:
CT HEAD WITHOUT CONTRAST
TECHNIQUE: Contiguous axial images were obtained from the base of the skull
through the vertex without intravenous contrast.

[Series 2: head wo · axial · 0.40mm/px · z∈[-113,-3]mm · 9 of 27 slices shown, 12 images]
[im 3/27  brain]
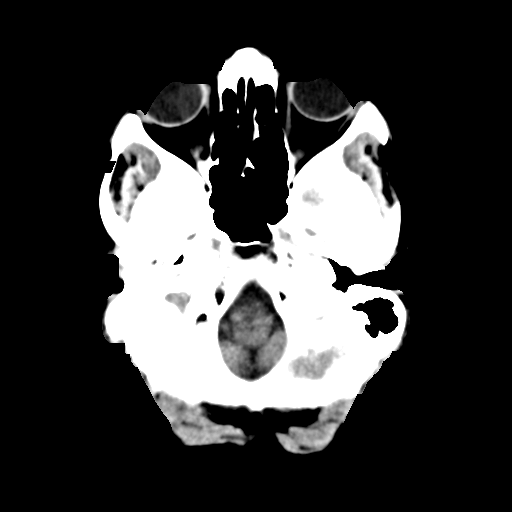
[im 3/27  bone]
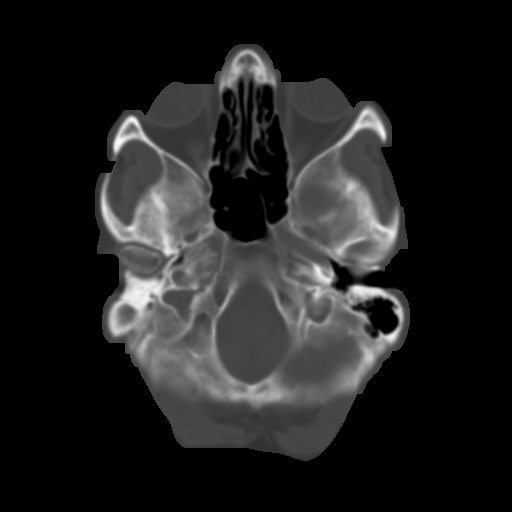
[im 6/27  brain]
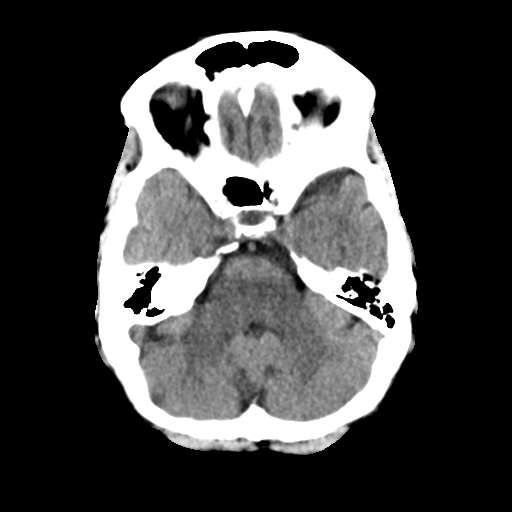
[im 8/27  brain]
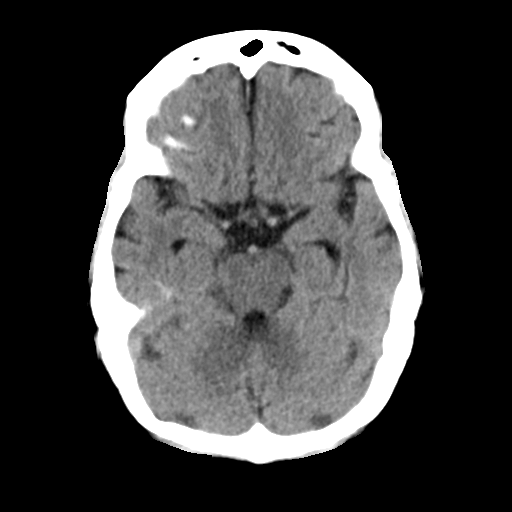
[im 11/27  brain]
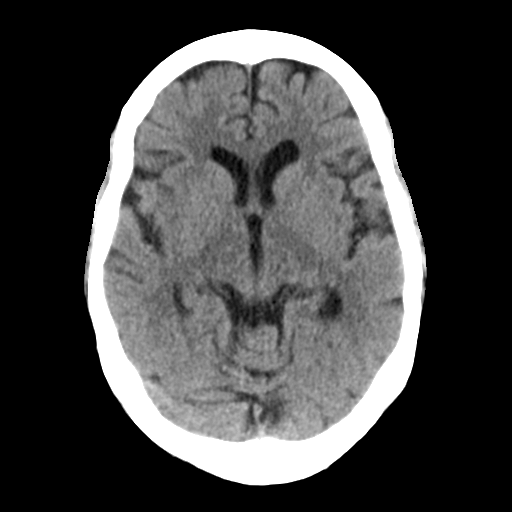
[im 14/27  brain]
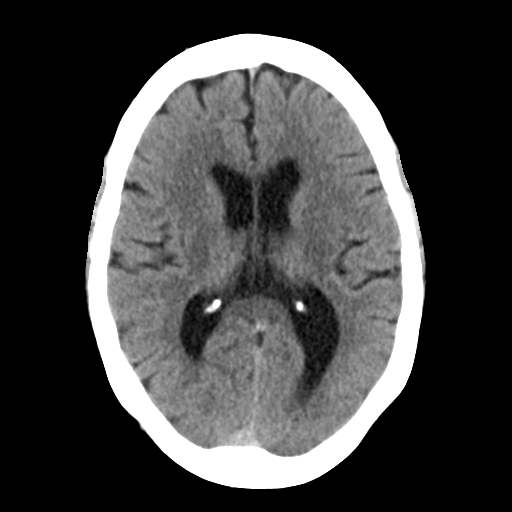
[im 14/27  bone]
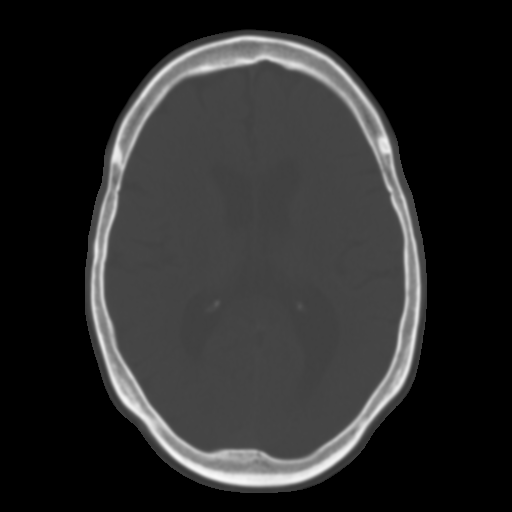
[im 17/27  brain]
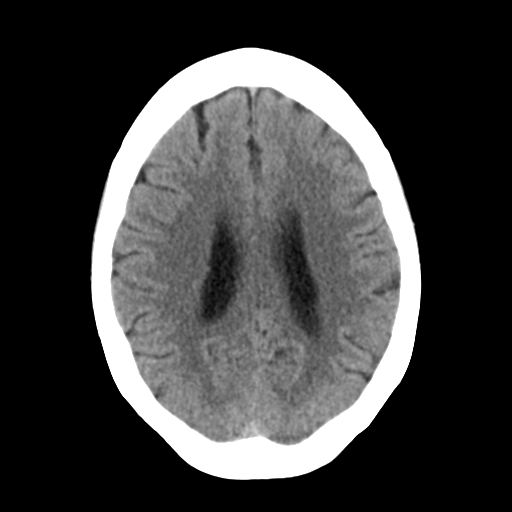
[im 20/27  brain]
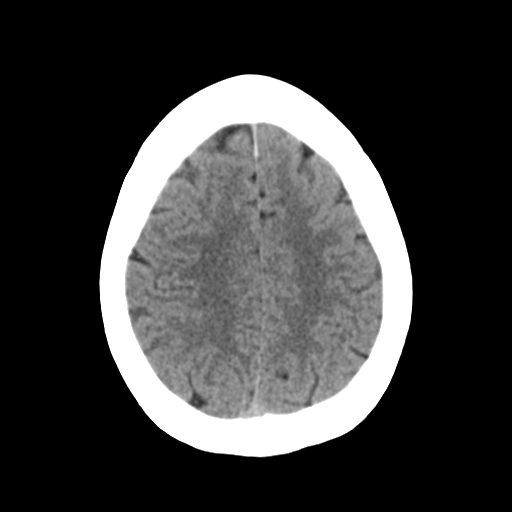
[im 22/27  brain]
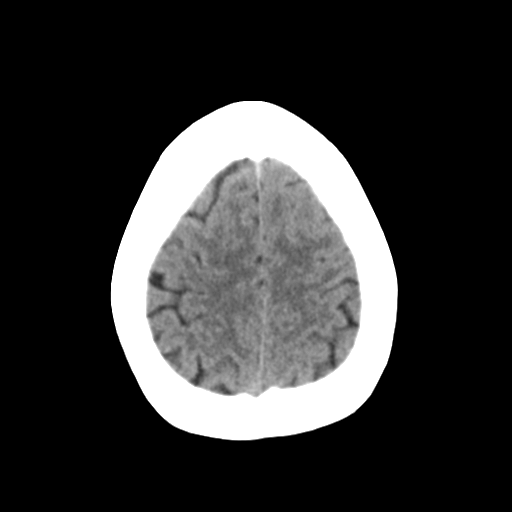
[im 25/27  brain]
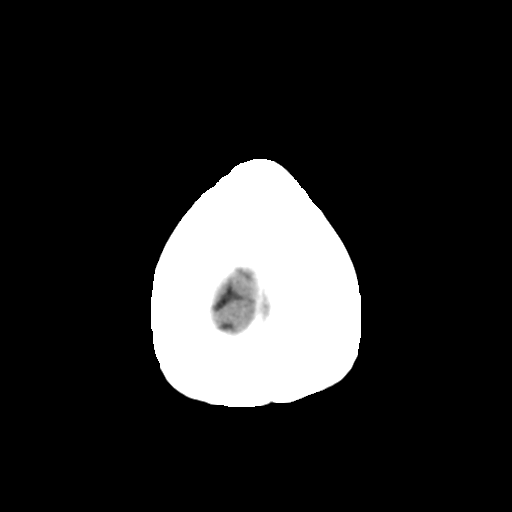
[im 25/27  bone]
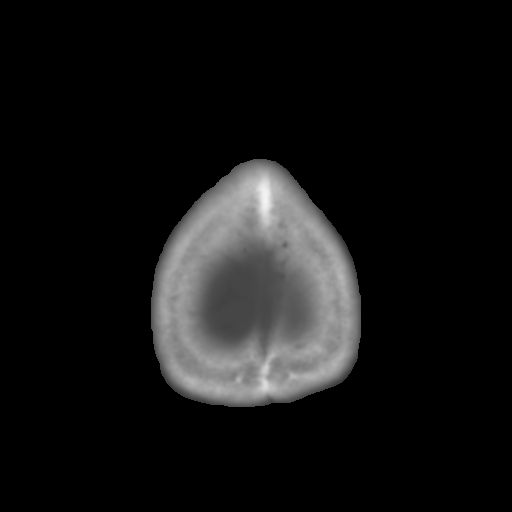

[Series 4: coronal soft tissue · coronal · 0.26mm/px · 3 of 61 slices shown]
[im 21/61  brain]
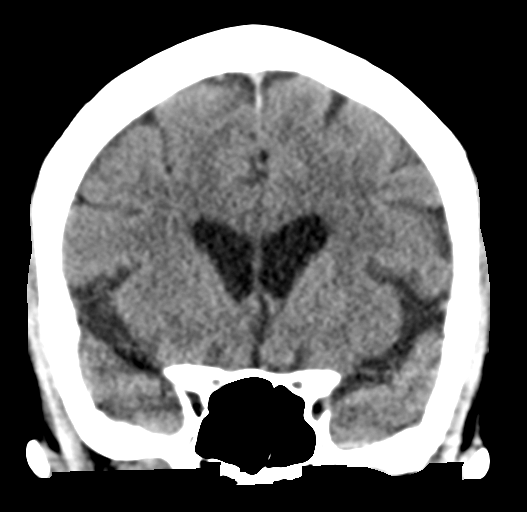
[im 27/61  brain]
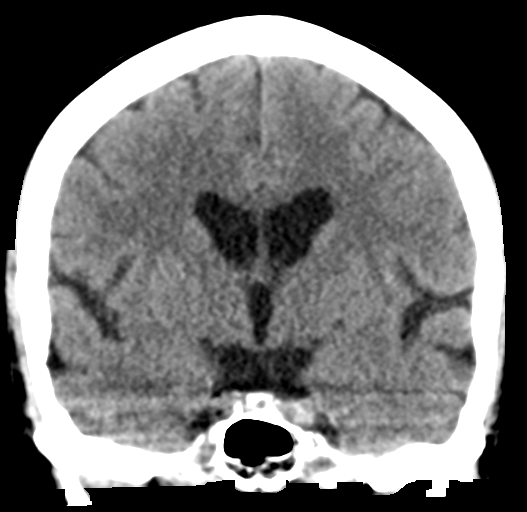
[im 34/61  brain]
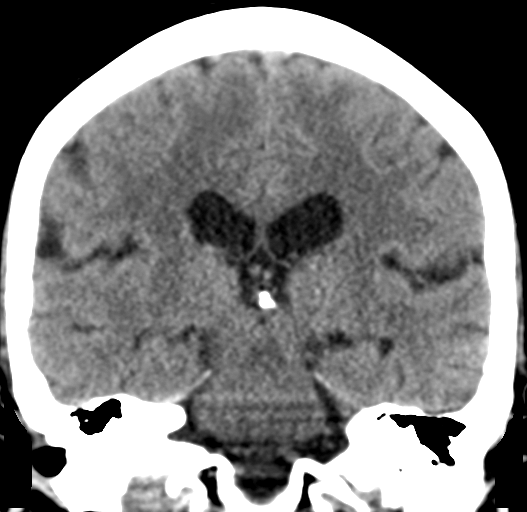

[Series 5: sagittal soft tissue · sagittal · 0.26mm/px · 3 of 47 slices shown]
[im 16/47  brain]
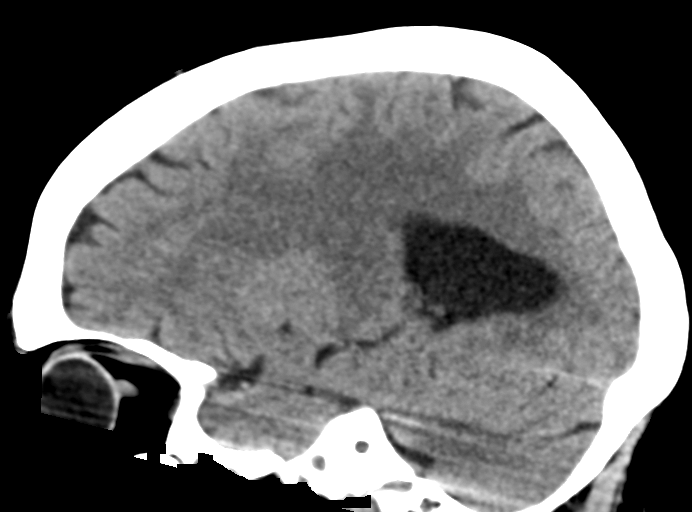
[im 24/47  brain]
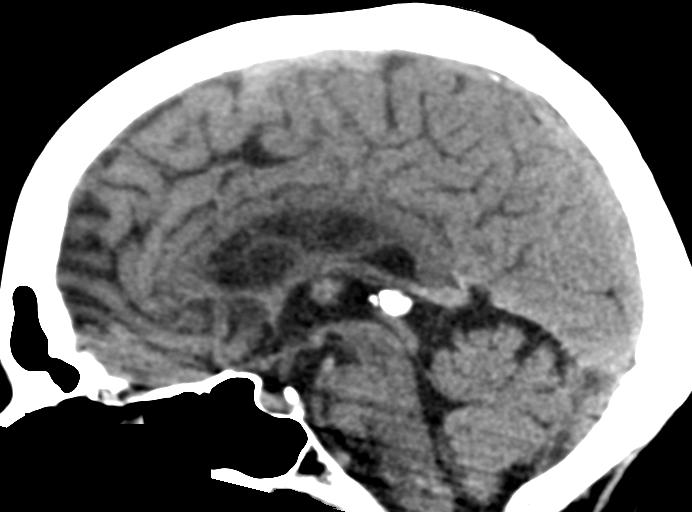
[im 31/47  brain]
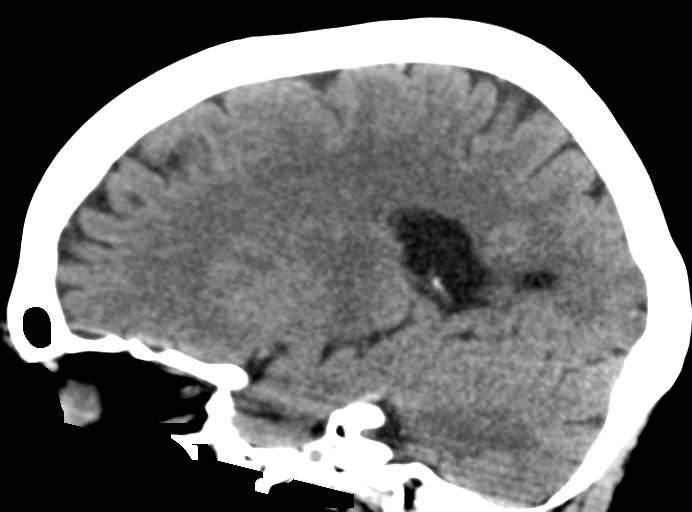

[15 of 44 positions shown; findings below may reference images not displayed]

FINDINGS: Brain: Mild atrophy.  Negative for hydrocephalus.

Negative for acute infarct, hemorrhage, mass lesion. No change from
the prior study.

Vascular: No hyperdense vessel or unexpected calcification.

Skull: Negative

Sinuses/Orbits: Negative

Other: None

ASPECTS (Alberta Stroke Program Early CT Score)

- Ganglionic level infarction (caudate, lentiform nuclei, internal
capsule, insula, M1-M3 cortex): 7

- Supraganglionic infarction (M4-M6 cortex): 3

Total score (0-10 with 10 being normal): 10
IMPRESSION: 1. No acute intracranial abnormality. Mild atrophy. No change from
the prior study
2. ASPECTS is 10

These results were called by telephone at the time of interpretation
on 08/12/2016 at [DATE] to Dr. HAUNS REZENDE , who verbally
acknowledged these results.

## 2019-03-11 ENCOUNTER — Other Ambulatory Visit: Payer: Self-pay | Admitting: Anesthesiology

## 2020-04-13 ENCOUNTER — Emergency Department: Payer: Medicare Other

## 2020-04-13 ENCOUNTER — Emergency Department
Admission: EM | Admit: 2020-04-13 | Discharge: 2020-04-13 | Disposition: A | Discharge status: Home / Self Care | Payer: Medicare Other | Source: Home / Self Care | Attending: Emergency Medicine | Admitting: Emergency Medicine

## 2020-04-13 ENCOUNTER — Other Ambulatory Visit: Payer: Self-pay

## 2020-04-13 DIAGNOSIS — Z7982 Long term (current) use of aspirin: Secondary | ICD-10-CM | POA: Insufficient documentation

## 2020-04-13 DIAGNOSIS — J8410 Pulmonary fibrosis, unspecified: Secondary | ICD-10-CM

## 2020-04-13 DIAGNOSIS — Z79899 Other long term (current) drug therapy: Secondary | ICD-10-CM | POA: Insufficient documentation

## 2020-04-13 DIAGNOSIS — Z91040 Latex allergy status: Secondary | ICD-10-CM | POA: Insufficient documentation

## 2020-04-13 DIAGNOSIS — G459 Transient cerebral ischemic attack, unspecified: Secondary | ICD-10-CM | POA: Insufficient documentation

## 2020-04-13 DIAGNOSIS — Z87891 Personal history of nicotine dependence: Secondary | ICD-10-CM | POA: Insufficient documentation

## 2020-04-13 LAB — TROPONIN I (HIGH SENSITIVITY)
Troponin I (High Sensitivity): 3 ng/L (ref <18)
Troponin I (High Sensitivity): <2 ng/L (ref <18)

## 2020-04-13 LAB — CBC
HCT: 40.3 % (ref 36.0–46.0)
Hemoglobin: 13.9 g/dL (ref 12.0–15.0)
MCH: 31 pg (ref 26.0–34.0)
MCHC: 34.5 g/dL (ref 30.0–36.0)
MCV: 90 fL (ref 80.0–100.0)
Platelets: 172 10*3/uL (ref 150–400)
RBC: 4.48 MIL/uL (ref 3.87–5.11)
RDW: 13.2 % (ref 11.5–15.5)
WBC: 4.4 10*3/uL (ref 4.0–10.5)
nRBC: 0 % (ref 0.0–0.2)

## 2020-04-13 LAB — BASIC METABOLIC PANEL
Anion gap: 5 (ref 5–15)
BUN: 13 mg/dL (ref 8–23)
CO2: 28 mmol/L (ref 22–32)
Calcium: 9.2 mg/dL (ref 8.9–10.3)
Chloride: 104 mmol/L (ref 98–111)
Creatinine, Ser: 0.93 mg/dL (ref 0.44–1.00)
GFR calc Af Amer: >60 mL/min (ref >60)
GFR calc non Af Amer: >60 mL/min (ref >60)
Glucose, Bld: 117 mg/dL — ABNORMAL HIGH (ref 70–99)
Potassium: 4.3 mmol/L (ref 3.5–5.1)
Sodium: 137 mmol/L (ref 135–145)

## 2020-04-13 MED ORDER — PREDNISONE 50 MG PO TABS: 50.0000 mg | ORAL_TABLET | Freq: Every day | ORAL | 0 refills | Status: DC

## 2020-04-13 MED ORDER — SODIUM CHLORIDE 0.9% FLUSH: 3.0000 mL | Freq: Once | INTRAVENOUS | Status: DC

## 2020-04-13 MED ORDER — IOHEXOL 350 MG/ML SOLN
75.0000 mL | Freq: Once | INTRAVENOUS | Status: AC | PRN
  Administered 2020-04-13: 75 mL via INTRAVENOUS
  Filled 2020-04-13: qty 75

## 2020-04-13 NOTE — ED Notes (Signed)
Patient transported to CT 

## 2020-04-13 NOTE — ED Provider Notes (Signed)
[facility] Emergency Department Provider Note   ____________________________________________    I have reviewed the triage vital signs and the nursing notes.   HISTORY  Chief Complaint Chest Pain     HPI Ashley Murillo is a 74 y.o. female who presents with complaints of left-sided chest discomfort, dry nonproductive cough and mild shortness of breath over the last 2 to 3 days.  She denies fevers or chills.  She has had both Covid vaccines.  Denies nausea or vomiting or diaphoresis.  No radiation.  No calf pain or swelling.  No recent travel in the last 2 months.  No sick contacts reported.  Has not take anything for this.  Does not smoke  Past Medical History:  Diagnosis Date  . Allergy   . Anxiety   . Arthritis   . Back muscle spasm   . Chronic back pain   . Diverticulosis   . GERD (gastroesophageal reflux disease)   . IBS (irritable bowel syndrome)   . Insomnia   . Low blood monocyte count   . Osteoporosis   . Stroke [facility]) 08/2016    Patient Active Problem List   Diagnosis Date Noted  . TIA (transient ischemic attack) 08/12/2016  . Back muscle spasm 02/23/2015  . Thoracic spondylosis with radiculopathy 02/23/2015  . 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 Anxiety state 04/21/2014  . Chronic pain 04/21/2014  . Acid reflux 04/21/2014  . Cannot sleep 04/21/2014  . OP (osteoporosis) 04/21/2014    Past Surgical History:  Procedure Laterality Date  . APPENDECTOMY    . CHOLECYSTECTOMY    . knee cast     left 2015  . uterine tumors      Prior to Admission medications   Medication Sig Start Date End Date Taking? Authorizing Provider  aspirin EC 81 MG EC tablet Take 1 tablet (81 mg total) by mouth daily. 08/13/16   Adrian Saran, MD  atorvastatin (LIPITOR) 20 MG tablet Take 10 mg by mouth daily. 05/07/18   [provider]  atorvastatin (LIPITOR) 40 MG tablet Take 1 tablet (40 mg total) by mouth daily at 6 PM. Patient not  taking: Reported on 06/06/2017 08/13/16   Adrian Saran, MD  BESIVANCE 0.6 % SUSP INSTILL 1 DROP both eyes at bedtime 03/19/15   [provider]  Cholecalciferol (VITAMIN D-1000 MAX ST) 1000 units tablet Take 1,000 mg by mouth daily.    [provider]  dicyclomine (BENTYL) 20 MG tablet TAKE 1 TABLET BY MOUTH 3 TIMES A DAY 09/09/16   Midge Minium, MD  diphenhydrAMINE (BENADRYL) 25 mg capsule Take 25 mg by mouth at bedtime as needed for allergies.     [provider]  DULoxetine (CYMBALTA) 60 MG capsule Take 60 mg by mouth daily. 11/17/14   [provider]  HYDROcodone-acetaminophen (NORCO/VICODIN) 5-325 MG per tablet Take 1-2 tablets every 6 hours as needed for severe pain Patient not taking: Reported on 06/06/2017 12/22/14   Renne Crigler, PA-C  latanoprost (XALATAN) 0.005 % ophthalmic solution Place 1 drop into both eyes at bedtime. 03/22/18   [provider]  LORazepam (ATIVAN) 1 MG tablet Take 1 tablet by mouth 2 (two) times daily as needed. 04/06/17   [provider]  pantoprazole (PROTONIX) 40 MG tablet Take 1 tablet (40 mg total) by mouth daily. 12/20/14   Gerhard Munch, MD  predniSONE (DELTASONE) 50 MG tablet Take 1 tablet (50 mg total) by mouth daily with breakfast. 04/13/20   Jene Every, MD  sucralfate (CARAFATE) 1 GM/10ML suspension Take 10 mLs (1 g total) by mouth 4 (four) times daily -  with meals and at bedtime. Patient not taking: Reported on 06/06/2017 12/20/14   Gerhard Munch, MD  traMADol (ULTRAM) 50 MG tablet Take 1 tablet (50 mg total) by mouth 2 (two) times daily as needed. 05/07/18   Yevette Edwards, MD  traZODone (DESYREL) 100 MG tablet Take 100 mg by mouth at bedtime as needed for sleep.  12/01/14   [provider]  zoledronic acid (RECLAST) 5 MG/100ML SOLN injection Inject 5 mg into the vein as needed. Once per year    [provider]     Allergies Sulfa antibiotics, Tape, and Latex  Family History  Problem  Relation Age of Onset  . GI problems Mother   . Cancer Father     Social History Social History   Tobacco Use  . Smoking status: Former Games developer  . Smokeless tobacco: Never Used  Substance Use Topics  . Alcohol use: No  . Drug use: No    Review of Systems  Constitutional: As above Eyes: No visual changes.  ENT: No sore throat. Cardiovascular: As above Respiratory: As above Gastrointestinal: No abdominal pain.   Genitourinary: Negative for dysuria. Musculoskeletal: Negative for back pain. Skin: Negative for rash. Neurological: Negative for headaches or weakness   ____________________________________________   PHYSICAL EXAM:  VITAL SIGNS: ED Triage Vitals  Enc Vitals Group     BP 04/13/20 0931 (!) 159/84     Pulse Rate 04/13/20 0931 73     Resp 04/13/20 0931 17     Temp 04/13/20 0931 98.1 F (36.7 C)     Temp Source 04/13/20 0931 Oral     SpO2 04/13/20 0931 100 %     Weight 04/13/20 0932 58.1 kg (128 lb)     Height 04/13/20 0932 1.524 m (5')     Head Circumference --      Peak Flow --      Pain Score 04/13/20 0931 5     Pain Loc --      Pain Edu? --      Excl. in GC? --     Constitutional: Alert and oriented.   Nose: No congestion/rhinnorhea. Mouth/Throat: Mucous membranes are moist.    Cardiovascular: Normal rate, regular rhythm.  Good peripheral circulation.  Heart sounds normal Respiratory: Normal respiratory effort.  No retractions. Lungs CTAB. Gastrointestinal: Soft and nontender. No distention.  No CVA tenderness.  Musculoskeletal: No lower extremity tenderness nor edema.  Warm and well perfused Neurologic:  Normal speech and language. No gross focal neurologic deficits are appreciated.  Skin:  Skin is warm, dry and intact. No rash noted. Psychiatric: Mood and affect are normal. Speech and behavior are normal.  ____________________________________________   LABS (all labs ordered are listed, but only abnormal results are displayed)  Labs  Reviewed  BASIC METABOLIC PANEL - Abnormal; Notable for the following components:      Result Value   Glucose, Bld 117 (*)    All other components within normal limits  CBC  TROPONIN I (HIGH SENSITIVITY)  TROPONIN I (HIGH SENSITIVITY)   ____________________________________________  EKG  ED ECG REPORT I, Jene Every, the attending physician, personally viewed and interpreted this ECG.  Date: 04/13/2020  Rhythm: normal sinus rhythm QRS Axis: normal Intervals: normal ST/T Wave abnormalities: Nonspecific ST changes Narrative Interpretation: no evidence of acute ischemia  ____________________________________________  RADIOLOGY  Chest x-ray demonstrates right upper lobe nodule, reviewed by  me will obtain CT imaging ____________________________________________   PROCEDURES  Procedure(s) performed: No  Procedures   Critical Care performed: No ____________________________________________   INITIAL IMPRESSION / ASSESSMENT AND PLAN / ED COURSE  Pertinent labs & imaging results that were available during my care of the patient were reviewed by me and considered in my medical decision making (see chart for details).  Patient presents with left-sided chest discomfort, dry cough, nonproductive.  Differential includes pneumonia, viral illness, PE, not consistent with ACS, EKG reassuring.  Troponin normal.  Chest x-ray demonstrates no clear pneumonia, possible spiculated nodule in the right upper lobe.  Will obtain CT imaging to further characterize nodule and evaluate for pulmonary embolism  CT scan negative for pulmonary embolism.  No evidence of spiculated nodule.  Discussed finding of likely pulmonary fibrosis with patient and the need for outpatient follow-up.  Will try steroid burst to see if this helps with her symptoms, she will follow-up with pulmonology      ____________________________________________   FINAL CLINICAL IMPRESSION(S) / ED DIAGNOSES  Final  diagnoses:  Pulmonary fibrosis (HCC)        Note:  This document was prepared using Dragon voice recognition software and may include unintentional dictation errors.   Jene Every, MD 04/13/20 1549

## 2020-04-13 NOTE — ED Notes (Signed)
Pt signed paper discharge form, placed in med rec

## 2020-04-13 NOTE — ED Notes (Signed)
Lab called for assistance with blood draw 

## 2020-04-13 NOTE — ED Triage Notes (Signed)
Pt c/o increased SOB over the past couple of days with left sided chest pain/presssure, dry cough. Pt is in NAD at present.

## 2020-04-15 ENCOUNTER — Inpatient Hospital Stay
Admission: EM | Admit: 2020-04-15 | Discharge: 2020-04-18 | DRG: 197 | Disposition: A | Discharge status: Home / Self Care | Payer: Medicare Other | Attending: Internal Medicine | Admitting: Internal Medicine

## 2020-04-15 ENCOUNTER — Other Ambulatory Visit: Payer: Self-pay

## 2020-04-15 ENCOUNTER — Observation Stay: Payer: Medicare Other

## 2020-04-15 ENCOUNTER — Encounter: Payer: Self-pay | Admitting: Emergency Medicine

## 2020-04-15 ENCOUNTER — Emergency Department: Payer: Medicare Other

## 2020-04-15 DIAGNOSIS — K219 Gastro-esophageal reflux disease without esophagitis: Secondary | ICD-10-CM | POA: Diagnosis present

## 2020-04-15 DIAGNOSIS — R0600 Dyspnea, unspecified: Secondary | ICD-10-CM | POA: Diagnosis not present

## 2020-04-15 DIAGNOSIS — R0602 Shortness of breath: Secondary | ICD-10-CM

## 2020-04-15 DIAGNOSIS — F329 Major depressive disorder, single episode, unspecified: Secondary | ICD-10-CM | POA: Diagnosis present

## 2020-04-15 DIAGNOSIS — D6851 Activated protein C resistance: Secondary | ICD-10-CM | POA: Diagnosis present

## 2020-04-15 DIAGNOSIS — Z91040 Latex allergy status: Secondary | ICD-10-CM

## 2020-04-15 DIAGNOSIS — M549 Dorsalgia, unspecified: Secondary | ICD-10-CM | POA: Diagnosis present

## 2020-04-15 DIAGNOSIS — M79606 Pain in leg, unspecified: Secondary | ICD-10-CM

## 2020-04-15 DIAGNOSIS — M79604 Pain in right leg: Secondary | ICD-10-CM | POA: Diagnosis not present

## 2020-04-15 DIAGNOSIS — F419 Anxiety disorder, unspecified: Secondary | ICD-10-CM | POA: Diagnosis present

## 2020-04-15 DIAGNOSIS — G4700 Insomnia, unspecified: Secondary | ICD-10-CM | POA: Diagnosis present

## 2020-04-15 DIAGNOSIS — G8929 Other chronic pain: Secondary | ICD-10-CM | POA: Diagnosis present

## 2020-04-15 DIAGNOSIS — Z20822 Contact with and (suspected) exposure to covid-19: Secondary | ICD-10-CM | POA: Diagnosis present

## 2020-04-15 DIAGNOSIS — Z882 Allergy status to sulfonamides status: Secondary | ICD-10-CM

## 2020-04-15 DIAGNOSIS — K589 Irritable bowel syndrome without diarrhea: Secondary | ICD-10-CM | POA: Diagnosis present

## 2020-04-15 DIAGNOSIS — M1990 Unspecified osteoarthritis, unspecified site: Secondary | ICD-10-CM | POA: Diagnosis present

## 2020-04-15 DIAGNOSIS — Z86718 Personal history of other venous thrombosis and embolism: Secondary | ICD-10-CM

## 2020-04-15 DIAGNOSIS — Z9109 Other allergy status, other than to drugs and biological substances: Secondary | ICD-10-CM

## 2020-04-15 DIAGNOSIS — J849 Interstitial pulmonary disease, unspecified: Secondary | ICD-10-CM | POA: Diagnosis not present

## 2020-04-15 DIAGNOSIS — Z7982 Long term (current) use of aspirin: Secondary | ICD-10-CM

## 2020-04-15 DIAGNOSIS — Z7952 Long term (current) use of systemic steroids: Secondary | ICD-10-CM

## 2020-04-15 DIAGNOSIS — E785 Hyperlipidemia, unspecified: Secondary | ICD-10-CM | POA: Diagnosis present

## 2020-04-15 DIAGNOSIS — Z87891 Personal history of nicotine dependence: Secondary | ICD-10-CM

## 2020-04-15 DIAGNOSIS — Z79899 Other long term (current) drug therapy: Secondary | ICD-10-CM

## 2020-04-15 DIAGNOSIS — M79605 Pain in left leg: Secondary | ICD-10-CM

## 2020-04-15 DIAGNOSIS — J84112 Idiopathic pulmonary fibrosis: Principal | ICD-10-CM | POA: Diagnosis present

## 2020-04-15 DIAGNOSIS — Z8673 Personal history of transient ischemic attack (TIA), and cerebral infarction without residual deficits: Secondary | ICD-10-CM

## 2020-04-15 LAB — BASIC METABOLIC PANEL
Anion gap: 11 (ref 5–15)
BUN: 18 mg/dL (ref 8–23)
CO2: 25 mmol/L (ref 22–32)
Calcium: 9.7 mg/dL (ref 8.9–10.3)
Chloride: 102 mmol/L (ref 98–111)
Creatinine, Ser: 0.93 mg/dL (ref 0.44–1.00)
GFR calc Af Amer: >60 mL/min (ref >60)
GFR calc non Af Amer: >60 mL/min (ref >60)
Glucose, Bld: 130 mg/dL — ABNORMAL HIGH (ref 70–99)
Potassium: 3.5 mmol/L (ref 3.5–5.1)
Sodium: 138 mmol/L (ref 135–145)

## 2020-04-15 LAB — CBC
HCT: 41.5 % (ref 36.0–46.0)
Hemoglobin: 14.2 g/dL (ref 12.0–15.0)
MCH: 30.5 pg (ref 26.0–34.0)
MCHC: 34.2 g/dL (ref 30.0–36.0)
MCV: 89.1 fL (ref 80.0–100.0)
Platelets: 238 10*3/uL (ref 150–400)
RBC: 4.66 MIL/uL (ref 3.87–5.11)
RDW: 13.6 % (ref 11.5–15.5)
WBC: 6.5 10*3/uL (ref 4.0–10.5)
nRBC: 0 % (ref 0.0–0.2)

## 2020-04-15 LAB — BRAIN NATRIURETIC PEPTIDE: B Natriuretic Peptide: 160.9 pg/mL — ABNORMAL HIGH (ref 0.0–100.0)

## 2020-04-15 LAB — TROPONIN I (HIGH SENSITIVITY)
Troponin I (High Sensitivity): 4 ng/L (ref <18)
Troponin I (High Sensitivity): 4 ng/L (ref <18)

## 2020-04-15 LAB — SARS CORONAVIRUS 2 BY RT PCR (HOSPITAL ORDER, PERFORMED IN ~~LOC~~ HOSPITAL LAB): SARS Coronavirus 2: NEGATIVE

## 2020-04-15 MED ORDER — ONDANSETRON HCL 4 MG/2ML IJ SOLN
4.0000 mg | Freq: Four times a day (QID) | INTRAMUSCULAR | Status: DC | PRN
  Administered 2020-04-15 – 2020-04-17 (×3): 4 mg via INTRAVENOUS
  Filled 2020-04-15 (×3): qty 2

## 2020-04-15 MED ORDER — SODIUM CHLORIDE 0.9 % IV SOLN
250.0000 mL | INTRAVENOUS | Status: DC | PRN
  Administered 2020-04-16: 250 mL via INTRAVENOUS

## 2020-04-15 MED ORDER — VITAMIN D 25 MCG (1000 UNIT) PO TABS
1000.0000 [IU] | ORAL_TABLET | Freq: Every day | ORAL | Status: DC
  Administered 2020-04-16 – 2020-04-18 (×3): 1000 [IU] via ORAL
  Filled 2020-04-15 (×3): qty 1

## 2020-04-15 MED ORDER — SODIUM CHLORIDE 0.9 % IV SOLN
1.0000 g | INTRAVENOUS | Status: DC
  Administered 2020-04-15 – 2020-04-16 (×2): 1 g via INTRAVENOUS
  Filled 2020-04-15: qty 1
  Filled 2020-04-15 (×2): qty 10

## 2020-04-15 MED ORDER — METHYLPREDNISOLONE SODIUM SUCC 125 MG IJ SOLR
80.0000 mg | INTRAMUSCULAR | Status: AC
  Administered 2020-04-15: 80 mg via INTRAVENOUS
  Filled 2020-04-15: qty 2

## 2020-04-15 MED ORDER — ACETAMINOPHEN 650 MG RE SUPP: 650.0000 mg | Freq: Four times a day (QID) | RECTAL | Status: DC | PRN

## 2020-04-15 MED ORDER — DIPHENHYDRAMINE HCL 25 MG PO CAPS: 25.0000 mg | ORAL_CAPSULE | Freq: Every evening | ORAL | Status: DC | PRN

## 2020-04-15 MED ORDER — MAGNESIUM HYDROXIDE 400 MG/5ML PO SUSP
30.0000 mL | Freq: Every day | ORAL | Status: DC | PRN
  Administered 2020-04-17: 30 mL via ORAL
  Filled 2020-04-15: qty 30

## 2020-04-15 MED ORDER — IPRATROPIUM-ALBUTEROL 0.5-2.5 (3) MG/3ML IN SOLN
3.0000 mL | Freq: Four times a day (QID) | RESPIRATORY_TRACT | Status: DC
  Administered 2020-04-15 – 2020-04-18 (×10): 3 mL via RESPIRATORY_TRACT
  Filled 2020-04-15 (×10): qty 3

## 2020-04-15 MED ORDER — TRAMADOL HCL 50 MG PO TABS
50.0000 mg | ORAL_TABLET | Freq: Two times a day (BID) | ORAL | Status: DC | PRN
  Administered 2020-04-16: 50 mg via ORAL
  Filled 2020-04-15: qty 1

## 2020-04-15 MED ORDER — TRAZODONE HCL 100 MG PO TABS
100.0000 mg | ORAL_TABLET | Freq: Every evening | ORAL | Status: DC | PRN
  Administered 2020-04-16 – 2020-04-18 (×2): 100 mg via ORAL
  Filled 2020-04-15 (×3): qty 1

## 2020-04-15 MED ORDER — SODIUM CHLORIDE 0.9% FLUSH: 3.0000 mL | INTRAVENOUS | Status: DC | PRN

## 2020-04-15 MED ORDER — ONDANSETRON HCL 4 MG PO TABS: 4.0000 mg | ORAL_TABLET | Freq: Four times a day (QID) | ORAL | Status: DC | PRN

## 2020-04-15 MED ORDER — INSULIN ASPART 100 UNIT/ML ~~LOC~~ SOLN
0.0000 [IU] | Freq: Three times a day (TID) | SUBCUTANEOUS | Status: DC
  Administered 2020-04-16: 4 [IU] via SUBCUTANEOUS
  Filled 2020-04-15 (×2): qty 1

## 2020-04-15 MED ORDER — AZITHROMYCIN 500 MG PO TABS
500.0000 mg | ORAL_TABLET | Freq: Every day | ORAL | Status: DC
  Administered 2020-04-16 – 2020-04-17 (×2): 500 mg via ORAL
  Filled 2020-04-15 (×2): qty 1

## 2020-04-15 MED ORDER — SODIUM CHLORIDE 0.9% FLUSH
3.0000 mL | Freq: Two times a day (BID) | INTRAVENOUS | Status: DC
  Administered 2020-04-15 – 2020-04-17 (×5): 3 mL via INTRAVENOUS

## 2020-04-15 MED ORDER — ENOXAPARIN SODIUM 40 MG/0.4ML ~~LOC~~ SOLN
40.0000 mg | SUBCUTANEOUS | Status: DC
  Administered 2020-04-15 – 2020-04-17 (×3): 40 mg via SUBCUTANEOUS
  Filled 2020-04-15 (×3): qty 0.4

## 2020-04-15 MED ORDER — GUAIFENESIN ER 600 MG PO TB12
600.0000 mg | ORAL_TABLET | Freq: Two times a day (BID) | ORAL | Status: DC
  Administered 2020-04-15 – 2020-04-18 (×6): 600 mg via ORAL
  Filled 2020-04-15 (×6): qty 1

## 2020-04-15 MED ORDER — IPRATROPIUM-ALBUTEROL 0.5-2.5 (3) MG/3ML IN SOLN
3.0000 mL | Freq: Once | RESPIRATORY_TRACT | Status: AC
  Administered 2020-04-15: 3 mL via RESPIRATORY_TRACT
  Filled 2020-04-15: qty 3

## 2020-04-15 MED ORDER — TRAZODONE HCL 50 MG PO TABS
25.0000 mg | ORAL_TABLET | Freq: Every evening | ORAL | Status: DC | PRN
  Administered 2020-04-15: 25 mg via ORAL
  Filled 2020-04-15: qty 1

## 2020-04-15 MED ORDER — ACETAMINOPHEN 325 MG PO TABS
650.0000 mg | ORAL_TABLET | Freq: Four times a day (QID) | ORAL | Status: DC | PRN
  Administered 2020-04-16: 650 mg via ORAL
  Filled 2020-04-15: qty 2

## 2020-04-15 MED ORDER — PANTOPRAZOLE SODIUM 40 MG PO TBEC
40.0000 mg | DELAYED_RELEASE_TABLET | Freq: Two times a day (BID) | ORAL | Status: DC
  Administered 2020-04-16 – 2020-04-18 (×5): 40 mg via ORAL
  Filled 2020-04-15 (×5): qty 1

## 2020-04-15 MED ORDER — SODIUM CHLORIDE 0.9 % IV SOLN: 500.0000 mg | INTRAVENOUS | Status: DC

## 2020-04-15 MED ORDER — HYDROCOD POLST-CPM POLST ER 10-8 MG/5ML PO SUER
5.0000 mL | Freq: Two times a day (BID) | ORAL | Status: DC | PRN
  Administered 2020-04-16: 5 mL via ORAL
  Filled 2020-04-15: qty 5

## 2020-04-15 MED ORDER — METHYLPREDNISOLONE SODIUM SUCC 40 MG IJ SOLR: 40.0000 mg | Freq: Three times a day (TID) | INTRAMUSCULAR | Status: DC

## 2020-04-15 MED ORDER — METHYLPREDNISOLONE SODIUM SUCC 40 MG IJ SOLR
40.0000 mg | Freq: Three times a day (TID) | INTRAMUSCULAR | Status: DC
  Administered 2020-04-16 (×2): 40 mg via INTRAVENOUS
  Filled 2020-04-15 (×2): qty 1

## 2020-04-15 MED ORDER — AZITHROMYCIN 500 MG PO TABS
500.0000 mg | ORAL_TABLET | Freq: Once | ORAL | Status: AC
  Administered 2020-04-15: 500 mg via ORAL
  Filled 2020-04-15: qty 1

## 2020-04-15 MED ORDER — DULOXETINE HCL 60 MG PO CPEP
60.0000 mg | ORAL_CAPSULE | Freq: Every day | ORAL | Status: DC
  Administered 2020-04-16 – 2020-04-18 (×3): 60 mg via ORAL
  Filled 2020-04-15 (×3): qty 1

## 2020-04-15 MED ORDER — ATORVASTATIN CALCIUM 20 MG PO TABS
20.0000 mg | ORAL_TABLET | Freq: Every day | ORAL | Status: DC
  Administered 2020-04-16 – 2020-04-17 (×3): 20 mg via ORAL
  Filled 2020-04-15 (×3): qty 1

## 2020-04-15 NOTE — ED Triage Notes (Signed)
Patient reports she was seen here 2 days ago for chest pain and SOB. Reports her cardiac workup was negative but she was told she has pulmonary fibrosis. Reports since then, she is having worsening SOB and is also having pain in bilateral legs.

## 2020-04-15 NOTE — H&P (Signed)
[place] at [facility]   PATIENT NAME: Ashley Murillo    MR#:  [id number]  DATE OF BIRTH:  [birth year]  DATE OF ADMISSION:  04/15/2020  PRIMARY CARE PHYSICIAN: Kandyce Rud, MD   REQUESTING/REFERRING PHYSICIAN: Sharyn Creamer, MD CHIEF COMPLAINT:   Chief Complaint  Patient presents with  . Shortness of Breath  . Leg Pain    HISTORY OF PRESENT ILLNESS:  Ashley Murillo  is a 74 y.o. Caucasian female with a known history of dyslipidemia, factor V Leiden, chronic back pain, osteoporosis, CVA and anxiety, who presented to the emergency room with acute onset worsening dyspnea since Monday with associated dry cough without significant wheezing.  She has not been feeling well on Sunday.  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  She denies any fever or chills.  No nausea or vomiting or abdominal pain.  She was seen in the ER 2 days ago and had a negative chest CT for PE but it did show suspected interstitial pulmonary fibrosis that she was not aware of.  She got vaccinated for COVID-19 with 1 dose of [employer] in April.  She denies any chest pain or palpitations.  She admitted to bilateral lower extremity pain since last night.  She stated that she gets a DVT about every 10 years.  She is not on any anticoagulants for factor V Leiden.  Upon presentation to the emergency room, blood pressure was 156/91 with otherwise normal vital signs.  Her pulse oximetry has been 100% on room air.  Labs revealed borderline potassium of 3.5 with otherwise normal BMP.  Her BNP was 160.9 and high-sensitivity troponin I of 4 and later the same.  CBC was within normal.  COVID-19 PCR came back negative..  Two-view chest x-ray showed stable scarring and fibrosis with no acute process.  EKG showed normal sinus rhythm with rate of 66 with nonspecific T wave abnormalities.  Chest CTA on 7/12 revealed:  1. Negative examination for pulmonary embolism. 2. Mild pulmonary fibrosis  in a pattern with no clear apical to basal gradient featuring predominantly subpleural peripheral interstitial opacity and some very fine nodular quality. Findings are in keeping with an "alternative diagnosis" pattern of fibrosis by ATS pulmonary fibrosis criteria, general differential considerations including hypersensitivity pneumonitis and sarcoidosis. Consider pulmonary referral and ILD protocol CT follow-up to assess for stability. 3. Age indeterminate wedge deformity of the T8 vertebral body. 4. Coronary artery disease and aortic Atherosclerosis.  The patient was given p.o. Zithromax and duo nebs as well as 80 mg of IV Solu-Medrol.  She will be admitted to an observation medically monitored bed for further evaluation and management. PAST MEDICAL HISTORY:   Past Medical History:  Diagnosis Date  . Allergy   . Anxiety   . Arthritis   . Back muscle spasm   . Chronic back pain   . Diverticulosis   . GERD (gastroesophageal reflux disease)   . IBS (irritable bowel syndrome)   . Insomnia   . Low blood monocyte count   . Osteoporosis   . Stroke [facility]) 08/2016  Factor V Leiden History of DVTs attributed to factor V Leiden. PAST SURGICAL HISTORY:   Past Surgical History:  Procedure Laterality Date  . APPENDECTOMY    . CHOLECYSTECTOMY    . knee cast     left 2015  . uterine tumors      SOCIAL HISTORY:   Social History   Tobacco Use  .  Smoking status: Former Games developer  . Smokeless tobacco: Never Used  Substance Use Topics  . Alcohol use: No    FAMILY HISTORY:   Family History  Problem Relation Age of Onset  . GI problems Mother   . Cancer Father     DRUG ALLERGIES:   Allergies  Allergen Reactions  . Sulfa Antibiotics Anaphylaxis  . Tape Other (See Comments)  . Latex Itching and Rash    REVIEW OF SYSTEMS:   ROS As per history of present illness. All pertinent systems were reviewed above. Constitutional,  HEENT, cardiovascular, respiratory, GI, GU,  musculoskeletal, neuro, psychiatric, endocrine,  integumentary and hematologic systems were reviewed and are otherwise  negative/unremarkable except for positive findings mentioned above in the HPI.   MEDICATIONS AT HOME:   Prior to Admission medications   Medication Sig Start Date End Date Taking? Authorizing Provider  atorvastatin (LIPITOR) 20 MG tablet Take 20 mg by mouth daily.   Yes [provider]  Cholecalciferol (VITAMIN D-1000 MAX ST) 1000 units tablet Take 1,000 mg by mouth daily.   Yes [provider]  diphenhydrAMINE (BENADRYL) 25 mg capsule Take 25 mg by mouth at bedtime as needed for allergies.    Yes [provider]  DULoxetine (CYMBALTA) 60 MG capsule Take 60 mg by mouth daily. 11/17/14  Yes [provider]  pantoprazole (PROTONIX) 40 MG tablet Take 1 tablet (40 mg total) by mouth daily. Patient taking differently: Take 40 mg by mouth 2 (two) times daily.  12/20/14  Yes Gerhard Munch, MD  predniSONE (DELTASONE) 50 MG tablet Take 1 tablet (50 mg total) by mouth daily with breakfast. 04/13/20  Yes Jene Every, MD  traMADol (ULTRAM) 50 MG tablet Take 1 tablet (50 mg total) by mouth 2 (two) times daily as needed. 05/07/18  Yes Yevette Edwards, MD  traZODone (DESYREL) 100 MG tablet Take 100 mg by mouth at bedtime as needed for sleep.  12/01/14  Yes [provider]  latanoprost (XALATAN) 0.005 % ophthalmic solution Place 1 drop into both eyes at bedtime. 03/22/18   [provider]      VITAL SIGNS:  Blood pressure (!) 157/68, pulse (!) 57, temperature 97.7 F (36.5 C), temperature source Oral, resp. rate 20, height 5' (1.524 m), weight 58.1 kg, SpO2 100 %.  PHYSICAL EXAMINATION:  Physical Exam  GENERAL:  74 y.o.-year-old Caucasian female patient lying in the bed with no acute distress.  EYES: Pupils equal, round, reactive to light and accommodation. No scleral icterus. Extraocular muscles intact.  HEENT: Head atraumatic,  normocephalic. Oropharynx and nasopharynx clear.  NECK:  Supple, no jugular venous distention. No thyroid enlargement, no tenderness.  LUNGS: Slightly diminished bibasal breath sounds with diminished expiratory airflow and mildly harsh vesicular breathing.  She had irritant cough with deep breathing.  CARDIOVASCULAR: Regular rate and rhythm, S1, S2 normal. No murmurs, rubs, or gallops.  ABDOMEN: Soft, nondistended, nontender. Bowel sounds present. No organomegaly or mass.  EXTREMITIES: No pedal edema, cyanosis, or clubbing.  NEUROLOGIC: Cranial nerves II through XII are intact. Muscle strength 5/5 in all extremities. Sensation intact. Gait not checked.  PSYCHIATRIC: The patient is alert and oriented x 3.  Normal affect and good eye contact. SKIN: No obvious rash, lesion, or ulcer.   LABORATORY PANEL:   CBC Recent Labs  Lab 04/15/20 1501  WBC 6.5  HGB 14.2  HCT 41.5  PLT 238   ------------------------------------------------------------------------------------------------------------------  Chemistries  Recent Labs  Lab 04/15/20 1501  NA 138  K  3.5  CL 102  CO2 25  GLUCOSE 130*  BUN 18  CREATININE 0.93  CALCIUM 9.7   ------------------------------------------------------------------------------------------------------------------  Cardiac Enzymes No results for input(s): TROPONINI in the last 168 hours. ------------------------------------------------------------------------------------------------------------------  RADIOLOGY:  DG Chest 2 View  Result Date: 04/15/2020 CLINICAL DATA:  Worsening shortness of breath, chest pain EXAM: CHEST - 2 VIEW COMPARISON:  04/13/2020 FINDINGS: Frontal and lateral views of the chest demonstrate stable background scarring and fibrosis. No airspace disease, effusion, or pneumothorax. Cardiac silhouette is stable. IMPRESSION: 1. Stable scarring and fibrosis.  No acute process. Electronically Signed   By: Sharlet Salina M.D.   On: 04/15/2020  15:06      IMPRESSION AND PLAN:   1.  Worsening dyspnea with new onset/newly diagnosed interstitial lung disease with differential diagnosis including hypersensitivity pneumonitis and sarcoidosis. -The patient will be admitted to an observation medical monitored bed. -We will continue antibiotic therapy with IV Rocephin and Zithromax. -We will continue steroid therapy with IV Solu-Medrol. -We will continue bronchodilator therapy with nebulized DuoNebs scheduled and as needed basis. -Mucolytic's will be provided and antitussives. -Pulmonary consultation will be obtained and will defer further work-up to the pulmonologist's discretion. -Dr. Belia Heman was notified about the patient by the ER physician and made recommendations. -I notified Dr. Karna Christmas as well for morning consult.  2.  Bilateral lower extremity pain. -Given her history of factor V Leiden, will obtain bilateral lower extremity venous duplex.  3.  Dyslipidemia. -We will continue statin therapy.  4.  GERD. -We will continue PPI therapy.  5.  Depression and anxiety. -We will continue Cymbalta and trazodone.  6.  Chronic back pain. -We will continue as needed tramadol.  7.  DVT prophylaxis. -Subcutaneous Lovenox    All the records are reviewed and case discussed with ED provider. The plan of care was discussed in details with the patient (and family). I answered all questions. The patient agreed to proceed with the above mentioned plan. Further management will depend upon hospital course.   CODE STATUS: Full code  Status is: Observation  The patient remains OBS appropriate and will d/c before 2 midnights.  Dispo: The patient is from: Home              Anticipated d/c is to: Home              Anticipated d/c date is: 1 day              Patient currently is not medically stable to d/c.   TOTAL TIME TAKING CARE OF THIS PATIENT: 55 minutes.    Hannah Beat M.D on 04/15/2020 at 9:08 PM  [facility]    From 7 PM-7 AM, contact night-coverage [email or web address]  CC: Primary care physician; Kandyce Rud, MD   Note: This dictation was prepared with Dragon dictation along with smaller phrase technology. Any transcriptional typo errors that result from this process are unintentional.

## 2020-04-15 NOTE — ED Notes (Signed)
ED Provider Quale at bedside. 

## 2020-04-15 NOTE — ED Provider Notes (Signed)
[facility] Emergency Department Provider Note   ____________________________________________   First MD Initiated Contact with Patient 04/15/20 1804     (approximate)  I have reviewed the triage vital signs and the nursing notes.   HISTORY  Chief Complaint Shortness of Breath and Leg Pain    HPI Ashley Murillo is a 74 y.o. female for evaluation of increasing shortness of breath  Patient has been having shortness of breath worsening over about 5 days.  A dry and occasionally productive cough.  But increased shortness of breath.  Was seen in the ER for the same about 2 days ago, but despite taking treatment including steroid she reports her symptoms seem to be progressively worsening and she is continues to feel an increased burden of shortness of breath  She does report a cough.  No known Covid exposure.  Reports vaccination from Covid  No chest pain at this time, reports it feels just like a shortness of breath but is not improving.  No leg swelling.  Did have some leg cramping in both of her lower legs last night which is completely gone away.   Past Medical History:  Diagnosis Date  . Allergy   . Anxiety   . Arthritis   . Back muscle spasm   . Chronic back pain   . Diverticulosis   . GERD (gastroesophageal reflux disease)   . IBS (irritable bowel syndrome)   . Insomnia   . Low blood monocyte count   . Osteoporosis   . Stroke [facility]) 08/2016    Patient Active Problem List   Diagnosis Date Noted  . Dyspnea 04/15/2020  . TIA (transient ischemic attack) 08/12/2016  . Back muscle spasm 02/23/2015  . Thoracic spondylosis with radiculopathy 02/23/2015  . 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 Anxiety state 04/21/2014  . Chronic pain 04/21/2014  . Acid reflux 04/21/2014  . Cannot sleep 04/21/2014  . OP (osteoporosis) 04/21/2014    Past Surgical History:  Procedure Laterality Date  . APPENDECTOMY    . CHOLECYSTECTOMY    . knee cast      left 2015  . uterine tumors      Prior to Admission medications   Medication Sig Start Date End Date Taking? Authorizing Provider  aspirin EC 81 MG EC tablet Take 1 tablet (81 mg total) by mouth daily. 08/13/16   Adrian Saran, MD  atorvastatin (LIPITOR) 20 MG tablet Take 10 mg by mouth daily. 05/07/18   [provider]  atorvastatin (LIPITOR) 40 MG tablet Take 1 tablet (40 mg total) by mouth daily at 6 PM. Patient not taking: Reported on 06/06/2017 08/13/16   Adrian Saran, MD  BESIVANCE 0.6 % SUSP INSTILL 1 DROP both eyes at bedtime 03/19/15   [provider]  Cholecalciferol (VITAMIN D-1000 MAX ST) 1000 units tablet Take 1,000 mg by mouth daily.    [provider]  dicyclomine (BENTYL) 20 MG tablet TAKE 1 TABLET BY MOUTH 3 TIMES A DAY 09/09/16   Midge Minium, MD  diphenhydrAMINE (BENADRYL) 25 mg capsule Take 25 mg by mouth at bedtime as needed for allergies.     [provider]  DULoxetine (CYMBALTA) 60 MG capsule Take 60 mg by mouth daily. 11/17/14   [provider]  HYDROcodone-acetaminophen (NORCO/VICODIN) 5-325 MG per tablet Take 1-2 tablets every 6 hours as needed for severe pain Patient not taking: Reported on 06/06/2017 12/22/14   Renne Crigler, PA-C  latanoprost (XALATAN) 0.005 % ophthalmic solution Place 1 drop into both  eyes at bedtime. 03/22/18   [provider]  LORazepam (ATIVAN) 1 MG tablet Take 1 tablet by mouth 2 (two) times daily as needed. 04/06/17   [provider]  pantoprazole (PROTONIX) 40 MG tablet Take 1 tablet (40 mg total) by mouth daily. 12/20/14   Gerhard MunchLockwood, Robert, MD  predniSONE (DELTASONE) 50 MG tablet Take 1 tablet (50 mg total) by mouth daily with breakfast. 04/13/20   Jene EveryKinner, Robert, MD  sucralfate (CARAFATE) 1 GM/10ML suspension Take 10 mLs (1 g total) by mouth 4 (four) times daily -  with meals and at bedtime. Patient not taking: Reported on 06/06/2017 12/20/14   Gerhard MunchLockwood, Robert, MD  traMADol (ULTRAM) 50 MG  tablet Take 1 tablet (50 mg total) by mouth 2 (two) times daily as needed. 05/07/18   Yevette EdwardsAdams, James G, MD  traZODone (DESYREL) 100 MG tablet Take 100 mg by mouth at bedtime as needed for sleep.  12/01/14   [provider]  zoledronic acid (RECLAST) 5 MG/100ML SOLN injection Inject 5 mg into the vein as needed. Once per year    [provider]    Allergies Sulfa antibiotics, Tape, and Latex  Family History  Problem Relation Age of Onset  . GI problems Mother   . Cancer Father     Social History Social History   Tobacco Use  . Smoking status: Former Games developermoker  . Smokeless tobacco: Never Used  Substance Use Topics  . Alcohol use: No  . Drug use: No    Review of Systems Constitutional: No fever/chills Eyes: No visual changes. ENT: No sore throat. Cardiovascular: Denies chest pain. Respiratory: See HPI Gastrointestinal: No abdominal pain.   Musculoskeletal: Negative for back pain.  Had pain in both of her lower legs last night but has gone away now. Skin: Negative for rash. Neurological: Negative for headaches, areas of focal weakness or numbness.    ____________________________________________   PHYSICAL EXAM:  VITAL SIGNS: ED Triage Vitals  Enc Vitals Group     BP 04/15/20 1434 (!) 156/91     Pulse Rate 04/15/20 1434 80     Resp 04/15/20 1434 18     Temp 04/15/20 1434 98.2 F (36.8 C)     Temp Source 04/15/20 1434 Oral     SpO2 04/15/20 1434 100 %     Weight 04/15/20 1435 128 lb (58.1 kg)     Height 04/15/20 1435 5' (1.524 m)     Head Circumference --      Peak Flow --      Pain Score 04/15/20 1435 5     Pain Loc --      Pain Edu? --      Excl. in GC? --     Constitutional: Alert and oriented. Well appearing and in no acute distress. Eyes: Conjunctivae are normal. Head: Atraumatic. Nose: No congestion/rhinnorhea. Mouth/Throat: Mucous membranes are moist. Neck: No stridor.  Cardiovascular: Normal rate, regular rhythm. Grossly normal heart  sounds.  Good peripheral circulation. Respiratory: The patient has been very mild tachypnea with respiratory rate about 22.  There is very mild accessory muscle use and she does appear slightly dyspneic.  Her lungs are very clear though.  She has an occasional dry cough.  No rhonchi.  Lungs CTAB.  Does not appear in distress but does appear slightly dyspneic. Gastrointestinal: Soft and nontender. No distention. Musculoskeletal: No lower extremity tenderness nor edema.  Strong peripheral pulses and capillary refill bilateral lower extremities. Neurologic:  Normal speech and language. No gross  focal neurologic deficits are appreciated.  Skin:  Skin is warm, dry and intact. No rash noted. Psychiatric: Mood and affect are normal. Speech and behavior are normal.  ____________________________________________   LABS (all labs ordered are listed, but only abnormal results are displayed)  Labs Reviewed  BASIC METABOLIC PANEL - Abnormal; Notable for the following components:      Result Value   Glucose, Bld 130 (*)    All other components within normal limits  SARS CORONAVIRUS 2 BY RT PCR (HOSPITAL ORDER, PERFORMED IN [facility])  CBC  BRAIN NATRIURETIC PEPTIDE  TROPONIN I (HIGH SENSITIVITY)  TROPONIN I (HIGH SENSITIVITY)   ____________________________________________  EKG  Reviewed and interpreted by me at 1440 Heart rate 65 QRS 80 QTc 410 Normal sinus rhythm, nonspecific T wave abnormality including biphasic appearance in V2 and V3.  No STEMI.  EKG appears similar to April 13, 2020 and there is also similar biphasic appearance from June 06, 2017 ____________________________________________  RADIOLOGY  DG Chest 2 View  Result Date: 04/15/2020 CLINICAL DATA:  Worsening shortness of breath, chest pain EXAM: CHEST - 2 VIEW COMPARISON:  04/13/2020 FINDINGS: Frontal and lateral views of the chest demonstrate stable background scarring and fibrosis. No airspace disease,  effusion, or pneumothorax. Cardiac silhouette is stable. IMPRESSION: 1. Stable scarring and fibrosis.  No acute process. Electronically Signed   By: Sharlet Salina M.D.   On: 04/15/2020 15:06     ____________________________________________   PROCEDURES  Procedure(s) performed: None  Procedures  Critical Care performed: No  ____________________________________________   INITIAL IMPRESSION / ASSESSMENT AND PLAN / ED COURSE  Pertinent labs & imaging results that were available during my care of the patient were reviewed by me and considered in my medical decision making (see chart for details).   Previous work-up including CT angio of the chest reviewed from 2 days ago.  Patient reports of worsening shortness of breath in the setting of a suspicion for possible pulmonary disease.  Has an appointment next week with pulmonary, but progressively feeling more short of breath.  Patient is slightly dyspneic but with clear lungs on steroid treatment.  Discussed with our pulmonologist Dr. Belia Heman and his advice given her slight but increasing dyspnea at this time is to admit the patient for echocardiogram, also consider possibility for pulmonary hypertension that could be undiagnosed.  He recommends treatment with a course of antibiotic, also add IV steroid and attempt nebulizer treatment to see if relief.  Discussed with the patient and her son, and I think that this is reasonable given her symptoms are not improving and seem to be worsening without a clear diagnosis as to cause and possible other causes such as pulmonary hypertension considered.  There is no evidence of an acute cardiac condition at this point that would suggest ischemia, but certainly this could remain on her differential and echocardiogram would be helpful.  Consultation has been placed, discussed with Dr. Belia Heman, who advises consult could be done by Dr. Karna Christmas tomorrow.  Admit discussed with Dr. Ervin Knack Fantroy was  evaluated in Emergency Department on 04/15/2020 for the symptoms described in the history of present illness. She was evaluated in the context of the global COVID-19 pandemic, which necessitated consideration that the patient might be at risk for infection with the SARS-CoV-2 virus that causes COVID-19. Institutional protocols and algorithms that pertain to the evaluation of patients at risk for COVID-19 are in a state of rapid change based on information released by regulatory  bodies including the CDC and federal and state organizations. These policies and algorithms were followed during the patient's care in the ED.       ____________________________________________   FINAL CLINICAL IMPRESSION(S) / ED DIAGNOSES  Final diagnoses:  Shortness of breath        Note:  This document was prepared using Dragon voice recognition software and may include unintentional dictation errors       Sharyn Creamer, MD 04/15/20 1945

## 2020-04-16 ENCOUNTER — Observation Stay
Admit: 2020-04-16 | Discharge: 2020-04-16 | Disposition: A | Discharge status: Home / Self Care | Payer: Medicare Other | Attending: Family Medicine | Admitting: Family Medicine

## 2020-04-16 ENCOUNTER — Other Ambulatory Visit: Payer: Self-pay

## 2020-04-16 ENCOUNTER — Encounter: Payer: Self-pay | Admitting: Family Medicine

## 2020-04-16 ENCOUNTER — Inpatient Hospital Stay: Payer: Medicare Other

## 2020-04-16 DIAGNOSIS — Z882 Allergy status to sulfonamides status: Secondary | ICD-10-CM | POA: Diagnosis not present

## 2020-04-16 DIAGNOSIS — Z79899 Other long term (current) drug therapy: Secondary | ICD-10-CM | POA: Diagnosis not present

## 2020-04-16 DIAGNOSIS — Z20822 Contact with and (suspected) exposure to covid-19: Secondary | ICD-10-CM | POA: Diagnosis present

## 2020-04-16 DIAGNOSIS — K219 Gastro-esophageal reflux disease without esophagitis: Secondary | ICD-10-CM | POA: Diagnosis present

## 2020-04-16 DIAGNOSIS — K589 Irritable bowel syndrome without diarrhea: Secondary | ICD-10-CM | POA: Diagnosis present

## 2020-04-16 DIAGNOSIS — Z91040 Latex allergy status: Secondary | ICD-10-CM | POA: Diagnosis not present

## 2020-04-16 DIAGNOSIS — Z8673 Personal history of transient ischemic attack (TIA), and cerebral infarction without residual deficits: Secondary | ICD-10-CM | POA: Diagnosis not present

## 2020-04-16 DIAGNOSIS — M549 Dorsalgia, unspecified: Secondary | ICD-10-CM | POA: Diagnosis present

## 2020-04-16 DIAGNOSIS — R0602 Shortness of breath: Secondary | ICD-10-CM | POA: Diagnosis not present

## 2020-04-16 DIAGNOSIS — Z7982 Long term (current) use of aspirin: Secondary | ICD-10-CM | POA: Diagnosis not present

## 2020-04-16 DIAGNOSIS — G4700 Insomnia, unspecified: Secondary | ICD-10-CM | POA: Diagnosis present

## 2020-04-16 DIAGNOSIS — Z7952 Long term (current) use of systemic steroids: Secondary | ICD-10-CM | POA: Diagnosis not present

## 2020-04-16 DIAGNOSIS — F419 Anxiety disorder, unspecified: Secondary | ICD-10-CM | POA: Diagnosis present

## 2020-04-16 DIAGNOSIS — M1990 Unspecified osteoarthritis, unspecified site: Secondary | ICD-10-CM | POA: Diagnosis present

## 2020-04-16 DIAGNOSIS — M79604 Pain in right leg: Secondary | ICD-10-CM | POA: Diagnosis present

## 2020-04-16 DIAGNOSIS — M79605 Pain in left leg: Secondary | ICD-10-CM | POA: Diagnosis present

## 2020-04-16 DIAGNOSIS — J84112 Idiopathic pulmonary fibrosis: Secondary | ICD-10-CM | POA: Diagnosis not present

## 2020-04-16 DIAGNOSIS — F329 Major depressive disorder, single episode, unspecified: Secondary | ICD-10-CM | POA: Diagnosis present

## 2020-04-16 DIAGNOSIS — D6851 Activated protein C resistance: Secondary | ICD-10-CM | POA: Diagnosis present

## 2020-04-16 DIAGNOSIS — Z87891 Personal history of nicotine dependence: Secondary | ICD-10-CM | POA: Diagnosis not present

## 2020-04-16 DIAGNOSIS — G8929 Other chronic pain: Secondary | ICD-10-CM | POA: Diagnosis present

## 2020-04-16 DIAGNOSIS — Z9109 Other allergy status, other than to drugs and biological substances: Secondary | ICD-10-CM | POA: Diagnosis not present

## 2020-04-16 DIAGNOSIS — E785 Hyperlipidemia, unspecified: Secondary | ICD-10-CM | POA: Diagnosis present

## 2020-04-16 DIAGNOSIS — Z86718 Personal history of other venous thrombosis and embolism: Secondary | ICD-10-CM | POA: Diagnosis not present

## 2020-04-16 LAB — BASIC METABOLIC PANEL
Anion gap: 13 (ref 5–15)
BUN: 18 mg/dL (ref 8–23)
CO2: 25 mmol/L (ref 22–32)
Calcium: 9 mg/dL (ref 8.9–10.3)
Chloride: 101 mmol/L (ref 98–111)
Creatinine, Ser: 1.02 mg/dL — ABNORMAL HIGH (ref 0.44–1.00)
GFR calc Af Amer: >60 mL/min (ref >60)
GFR calc non Af Amer: 54 mL/min — ABNORMAL LOW (ref >60)
Glucose, Bld: 149 mg/dL — ABNORMAL HIGH (ref 70–99)
Potassium: 3.7 mmol/L (ref 3.5–5.1)
Sodium: 139 mmol/L (ref 135–145)

## 2020-04-16 LAB — CBC
HCT: 38.8 % (ref 36.0–46.0)
Hemoglobin: 13.7 g/dL (ref 12.0–15.0)
MCH: 31.1 pg (ref 26.0–34.0)
MCHC: 35.3 g/dL (ref 30.0–36.0)
MCV: 88 fL (ref 80.0–100.0)
Platelets: 242 10*3/uL (ref 150–400)
RBC: 4.41 MIL/uL (ref 3.87–5.11)
RDW: 13.8 % (ref 11.5–15.5)
WBC: 6.4 10*3/uL (ref 4.0–10.5)
nRBC: 0 % (ref 0.0–0.2)

## 2020-04-16 LAB — HEMOGLOBIN A1C
Hgb A1c MFr Bld: 5.6 % (ref 4.8–5.6)
Mean Plasma Glucose: 114.02 mg/dL

## 2020-04-16 LAB — ECHOCARDIOGRAM COMPLETE
Height: 60 in
Weight: 2048 oz

## 2020-04-16 LAB — GLUCOSE, CAPILLARY
Glucose-Capillary: 128 mg/dL — ABNORMAL HIGH (ref 70–99)
Glucose-Capillary: 180 mg/dL — ABNORMAL HIGH (ref 70–99)
Glucose-Capillary: 90 mg/dL (ref 70–99)
Glucose-Capillary: 123 mg/dL — ABNORMAL HIGH (ref 70–99)
Glucose-Capillary: 156 mg/dL — ABNORMAL HIGH (ref 70–99)

## 2020-04-16 MED ORDER — POTASSIUM CHLORIDE 20 MEQ PO PACK
40.0000 meq | PACK | Freq: Once | ORAL | Status: AC
  Administered 2020-04-16: 40 meq via ORAL
  Filled 2020-04-16: qty 2

## 2020-04-16 MED ORDER — FUROSEMIDE 10 MG/ML IJ SOLN
20.0000 mg | Freq: Two times a day (BID) | INTRAMUSCULAR | Status: DC
  Administered 2020-04-16 (×2): 20 mg via INTRAVENOUS
  Filled 2020-04-16 (×2): qty 4

## 2020-04-16 MED ORDER — METHYLPREDNISOLONE SODIUM SUCC 125 MG IJ SOLR
125.0000 mg | Freq: Three times a day (TID) | INTRAMUSCULAR | Status: DC
  Administered 2020-04-16 – 2020-04-17 (×3): 125 mg via INTRAVENOUS
  Filled 2020-04-16 (×3): qty 2

## 2020-04-16 MED ORDER — LORAZEPAM 1 MG PO TABS
1.0000 mg | ORAL_TABLET | Freq: Once | ORAL | Status: AC
  Administered 2020-04-16: 1 mg via ORAL
  Filled 2020-04-16: qty 1

## 2020-04-16 MED ORDER — INSULIN ASPART 100 UNIT/ML ~~LOC~~ SOLN
0.0000 [IU] | Freq: Three times a day (TID) | SUBCUTANEOUS | Status: DC
  Administered 2020-04-16: 3 [IU] via SUBCUTANEOUS
  Administered 2020-04-17: 1 [IU] via SUBCUTANEOUS
  Administered 2020-04-17: 2 [IU] via SUBCUTANEOUS
  Filled 2020-04-16 (×3): qty 1

## 2020-04-16 NOTE — Plan of Care (Signed)

## 2020-04-16 NOTE — Progress Notes (Signed)
*  PRELIMINARY RESULTS* Echocardiogram 2D Echocardiogram has been performed.  Cristela Blue 04/16/2020, 10:20 AM

## 2020-04-16 NOTE — ED Notes (Signed)
REPORT PROVIDED TO FLOOR RN. TECH TO TRANSPORT PT TO 155.

## 2020-04-16 NOTE — Progress Notes (Signed)
Ch visited with Pt's son Ethelene Browns, and Pt's sister in response to RR PG. Ethelene Browns started crying looking at chaplain badge thinking that something serious was going on with his mother. He shared that he lost his dad only a month ago. Ch checked in with RN to make sure Pt was okay, and assured Ethelene Browns that she is okay, and he will be allowed to see her soon. Pt was being taken to CT at this time, Ch stayed with Pt's sister while Ethelene Browns went to get lunch for Pt. Ch will follow-up later.

## 2020-04-16 NOTE — Progress Notes (Signed)
Rapid Response Event Note  Overview: Time Called: 1347 Arrival Time: 1349 Event Type: Neurologic, Cardiac  Initial Focused Assessment:   Pt reported headache, shoulder pain and feels like heart is pounding.    Interventions:   12 lead  EKG, CT scan without contrast, 1 mg Ativan   Rapid response nurse transport pt to CT.   Event Summary: Name of Physician Notified: Dr. Chipper Herb at 1347    at    Outcome: Stayed in room and stabalized  Event End Time: 1427  Wilfred Lacy

## 2020-04-16 NOTE — Significant Event (Signed)
Rapid Response Event Note  Overview: Time Called: 1347 Arrival Time: 1349 Event Type: Neurologic, Cardiac  Initial Focused Assessment: Rapid response RN arrived in patient's room with patient's RN and other RNs at bedside. Patient in bed and reported headache, shoulder and neck pain, and that her heart felt like it was pounding. Patient has just received tramadol at 13:36. Initial vital signs: HR 115 ST, BP 153/79 MAP 100, RR 20 unlabored, o2 sat 100% on room air. No focal deficits observed on assessment. Patient alert and oriented. MD arrived during assessment and patient reported to him that she felt dizzy as he was assessing her.  Interventions: CBG 90(was 180 at noon and patient got 4 units of insulin). MD ordered 12 lead EKG (see in results review), 1 mg PO ativan, and CT head without contrast. Patient reported just before leaving for CT that she has "spells" at home sometimes that are relieved by her prn PO ativan. Rapid response RN transported patient to and from CT with no difficulties.  Plan of Care (if not transferred): Plan to stay on 1A for now. Dr. Chipper Herb told son at bedside that he would review CT results when they were available.  Event Summary: Name of Physician Notified: Dr. Chipper Herb at 1347    at    Outcome: Stayed in room and stabalized  Event End Time: [address] [facility] [place]

## 2020-04-16 NOTE — Progress Notes (Signed)
PROGRESS NOTE    Ashley Murillo  FYB:[id number] DOB: 1946-01-04 DOA: 04/15/2020 PCP: Derinda Late, MD   Complaint.  Shortness of breath. Brief Narrative:  Ashley Murillo  is a 74 y.o. Caucasian female with a known history of dyslipidemia, factor V Leiden, chronic back pain, osteoporosis, CVA and anxiety, who presented to the emergency room with acute onset worsening dyspnea since Monday with associated dry cough without significant wheezing.  She had a CT scan 2 days ago showed interstitial pulmonary fibrosis, no PE.  She is a placed on IV steroids and antibiotics for exacerbationof pulmonary fibrosis.  Cardiology and pulmonology consult is obtained.    Assessment & Plan:   Active Problems:   Dyspnea  #1.  Exacerbation of pulmonary fibrosis. Patient states that the she was diagnosed with pulmonary fibrosis about 2 years ago, but she has been doing well without shortness of breath.  Symptoms started about 10 days ago.  She has been seen by cardiology, she has very minimal elevation of BNP, she is put on IV Lasix at lower dose.  She will be also eval by pulmonology. We will continue current antibiotics with Zithromax and Rocephin. I will increase steroids to 125 mg every 8 hours. Currently patient does not have hypoxemia. Pending echocardiogram to evaluate pulmonary hypertension.  2.  Bilateral leg pain. Duplex ultrasound did not show any DVT.  3.  Chronic back pain. Symptomatic treatment.   DVT prophylaxis: Lovenox Code Status: Full Family Communication:Son in room. Disposition Plan:  . Patient came from:Home            . Anticipated d/c place: Home . Barriers to d/c OR conditions which need to be met to effect a safe d/c:   Consultants:   Cardiology and pulmonology  Procedures: None  Antimicrobials:  Zithromax and Rocephin.   Subjective: Patient still complaining of short of breath, worse with minimal exertion.  Cough, nonproductive. No fever chills.  No  diarrhea constipation or abdominal pain.  Objective: Vitals:   04/16/20 0123 04/16/20 0341 04/16/20 0744 04/16/20 0807  BP: 140/67 (!) 145/84  135/73  Pulse: 61 64  80  Resp: 20 18  18   Temp: (!) 97.5 F (36.4 C) 98.1 F (36.7 C)  97.8 F (36.6 C)  TempSrc: Oral Oral  Oral  SpO2: 98% 99% 98% 100%  Weight:      Height:        Intake/Output Summary (Last 24 hours) at 04/16/2020 1115 Last data filed at 04/16/2020 0303 Gross per 24 hour  Intake 100 ml  Output --  Net 100 ml   Filed Weights   04/15/20 1435  Weight: 58.1 kg    Examination:  General exam: Appears calm and comfortable  Respiratory system: Coarse breathing sounds without wheezes or crackles. Respiratory effort normal. Cardiovascular system: S1 & S2 heard, RRR. No JVD, murmurs, rubs, gallops or clicks. No pedal edema. Gastrointestinal system: Abdomen is nondistended, soft and nontender. No organomegaly or masses felt. Normal bowel sounds heard. Central nervous system: Alert and oriented. No focal neurological deficits. Extremities: Symmetric 5 x 5 power. Skin: No rashes, lesions or ulcers Psychiatry: Judgement and insight appear normal. Mood & affect appropriate.     Data Reviewed: I have personally reviewed following labs and imaging studies  CBC: Recent Labs  Lab 04/13/20 1015 04/15/20 1501 04/16/20 0428  WBC 4.4 6.5 6.4  HGB 13.9 14.2 13.7  HCT 40.3 41.5 38.8  MCV 90.0 89.1 88.0  PLT 172 238 527   Basic Metabolic  Panel: Recent Labs  Lab 04/13/20 1015 04/15/20 1501 04/16/20 0428  NA 137 138 139  K 4.3 3.5 3.7  CL 104 102 101  CO2 28 25 25   GLUCOSE 117* 130* 149*  BUN 13 18 18   CREATININE 0.93 0.93 1.02*  CALCIUM 9.2 9.7 9.0   GFR: Estimated Creatinine Clearance: 38.6 mL/min (A) (by C-G formula based on SCr of 1.02 mg/dL (H)). Liver Function Tests: No results for input(s): AST, ALT, ALKPHOS, BILITOT, PROT, ALBUMIN in the last 168 hours. No results for input(s): LIPASE, AMYLASE in the  last 168 hours. No results for input(s): AMMONIA in the last 168 hours. Coagulation Profile: No results for input(s): INR, PROTIME in the last 168 hours. Cardiac Enzymes: No results for input(s): CKTOTAL, CKMB, CKMBINDEX, TROPONINI in the last 168 hours. BNP (last 3 results) No results for input(s): PROBNP in the last 8760 hours. HbA1C: Recent Labs    04/16/20 0428  HGBA1C 5.6   CBG: Recent Labs  Lab 04/16/20 0837  GLUCAP 128*   Lipid Profile: No results for input(s): CHOL, HDL, LDLCALC, TRIG, CHOLHDL, LDLDIRECT in the last 72 hours. Thyroid Function Tests: No results for input(s): TSH, T4TOTAL, FREET4, T3FREE, THYROIDAB in the last 72 hours. Anemia Panel: No results for input(s): VITAMINB12, FOLATE, FERRITIN, TIBC, IRON, RETICCTPCT in the last 72 hours. Sepsis Labs: No results for input(s): PROCALCITON, LATICACIDVEN in the last 168 hours.  Recent Results (from the past 240 hour(s))  SARS Coronavirus 2 by RT PCR (hospital order, performed in [facility] hospital lab) Nasopharyngeal Nasopharyngeal Swab     Status: None   Collection Time: 04/15/20  7:23 PM   Specimen: Nasopharyngeal Swab  Result Value Ref Range Status   SARS Coronavirus 2 NEGATIVE NEGATIVE Final    Comment: (NOTE) SARS-CoV-2 target nucleic acids are NOT DETECTED.  The SARS-CoV-2 RNA is generally detectable in upper and lower respiratory specimens during the acute phase of infection. The lowest concentration of SARS-CoV-2 viral copies this assay can detect is 250 copies / mL. A negative result does not preclude SARS-CoV-2 infection and should not be used as the sole basis for treatment or other patient management decisions.  A negative result may occur with improper specimen collection / handling, submission of specimen other than nasopharyngeal swab, presence of viral mutation(s) within the areas targeted by this assay, and inadequate number of viral copies (<250 copies / mL). A negative result must be  combined with clinical observations, patient history, and epidemiological information.  Fact Sheet for Patients:   StrictlyIdeas.no  Fact Sheet for Healthcare Providers: BankingDealers.co.za  This test is not yet approved or  cleared by the [place] FDA and has been authorized for detection and/or diagnosis of SARS-CoV-2 by FDA under an Emergency Use Authorization (EUA).  This EUA will remain in effect (meaning this test can be used) for the duration of the COVID-19 declaration under Section 564(b)(1) of the Act, 21 U.S.C. section 360bbb-3(b)(1), unless the authorization is terminated or revoked sooner.  Performed at [facility], [address] [place], [place] [address]          Radiology Studies: DG Chest 2 View  Result Date: 04/15/2020 CLINICAL DATA:  Worsening shortness of breath, chest pain EXAM: CHEST - 2 VIEW COMPARISON:  04/13/2020 FINDINGS: Frontal and lateral views of the chest demonstrate stable background scarring and fibrosis. No airspace disease, effusion, or pneumothorax. Cardiac silhouette is stable. IMPRESSION: 1. Stable scarring and fibrosis.  No acute process. Electronically Signed   By:  Randa Ngo M.D.   On: 04/15/2020 15:06   US Venous Img Lower Bilateral (DVT)  Result Date: 04/16/2020 CLINICAL DATA:  74 year old female with bilateral leg pain. History of clotting disorder factor 5 Leiden. EXAM: BILATERAL LOWER EXTREMITY VENOUS DOPPLER ULTRASOUND TECHNIQUE: Gray-scale sonography with graded compression, as well as color Doppler and duplex ultrasound were performed to evaluate the lower extremity deep venous systems from the level of the common femoral vein and including the common femoral, femoral, profunda femoral, popliteal and calf veins including the posterior tibial, peroneal and gastrocnemius veins when visible. The superficial great saphenous vein was also interrogated. Spectral Doppler was  utilized to evaluate flow at rest and with distal augmentation maneuvers in the common femoral, femoral and popliteal veins. COMPARISON:  None. FINDINGS: RIGHT LOWER EXTREMITY Common Femoral Vein: No evidence of thrombus. Normal compressibility, respiratory phasicity and response to augmentation. Saphenofemoral Junction: No evidence of thrombus. Normal compressibility and flow on color Doppler imaging. Profunda Femoral Vein: No evidence of thrombus. Normal compressibility and flow on color Doppler imaging. Femoral Vein: No evidence of thrombus. Normal compressibility, respiratory phasicity and response to augmentation. Popliteal Vein: No evidence of thrombus. Normal compressibility, respiratory phasicity and response to augmentation. Calf Veins: No evidence of thrombus. Normal compressibility and flow on color Doppler imaging. Other Findings:  None. LEFT LOWER EXTREMITY Common Femoral Vein: No evidence of thrombus. Normal compressibility, respiratory phasicity and response to augmentation. Saphenofemoral Junction: No evidence of thrombus. Normal compressibility and flow on color Doppler imaging. Profunda Femoral Vein: No evidence of thrombus. Normal compressibility and flow on color Doppler imaging. Femoral Vein: No evidence of thrombus. Normal compressibility, respiratory phasicity and response to augmentation. Popliteal Vein: No evidence of thrombus. Normal compressibility, respiratory phasicity and response to augmentation. Calf Veins: No evidence of thrombus. Normal compressibility and flow on color Doppler imaging. Other Findings:  None. IMPRESSION: No evidence of deep venous thrombosis in either lower extremity. Electronically Signed   By: Genevie Ann M.D.   On: 04/16/2020 00:43        Scheduled Meds: . atorvastatin  20 mg Oral Daily  . azithromycin  500 mg Oral Daily  . cholecalciferol  1,000 Units Oral Daily  . DULoxetine  60 mg Oral Daily  . enoxaparin (LOVENOX) injection  40 mg Subcutaneous Q24H   . furosemide  20 mg Intravenous Q12H  . guaiFENesin  600 mg Oral BID  . insulin aspart  0-20 Units Subcutaneous TID AC & HS  . ipratropium-albuterol  3 mL Nebulization QID  . methylPREDNISolone (SOLU-MEDROL) injection  125 mg Intravenous Q8H  . pantoprazole  40 mg Oral BID  . sodium chloride flush  3 mL Intravenous Q12H   Continuous Infusions: . sodium chloride    . cefTRIAXone (ROCEPHIN)  IV Stopped (04/16/20 0014)     LOS: 0 days    Time spent: 28 minutes    Sharen Hones, MD [facility]   To contact the attending provider between 7A-7P or the covering provider during after hours 7P-7A, please log into the web site [email or web address] and access using universal [place] password for that web site. If you do not have the password, please call the hospital operator.  04/16/2020, 11:15 AM

## 2020-04-16 NOTE — Consult Note (Signed)
CARDIOLOGY CONSULT NOTE               Patient ID: Ashley Murillo MRN: [id number] DOB/AGE: 1946/09/29 74 y.o.  Admit date: 04/15/2020 Referring Physician [facility] Primary Physician [facility] Primary Cardiologist none per patient Reason for Consultation shortness of breath, elevated BNP  HPI: 74 year old female referred for evaluation of shortness of breath.  The patient has a history of dyslipidemia, factor V Leiden, CVA, DVT, chronic back pain, and anxiety. The patient reports being in her usual state of health until 04/13/2020 when she began experiencing significant coughing with minimal exertion with associated weakness. She presented to [facility] ER where chest CT revealed mild pulmonary fibrosis, without pleural effusion or pulmonary embolism, with left coronary artery calcifications, and scattered aortic atherosclerosis.  She was discharged on steroids.  The patient returned to [facility] ER on 04/15/2020 for worsening exertional cough and shortness of breath.  In the ER, her oxygen saturation on room air was 100%.  Labs revealed mildly elevated BNP of 160, normal high sensitivity troponin (4, 4), unremarkable CBC and metabolic panel, and negative Covid.  ECG revealed normal sinus rhythm at a rate of 64 bpm with nonspecific T wave abnormalities, similar to ECG 2 days prior, without dynamic changes.  The patient was started on Zithromax, duo nebs, Solu-Medrol, and a pulmonary consult was placed. Her main complaint at this time is cough induced by minimal exertion, such as walking around in her room. The patient denies chest pain.  She denies any recent peripheral edema or orthopnea.  She denies a known cardiac history.  She works as a Interior and spatial designer.  Review of systems complete and found to be negative unless listed above     Past Medical History:  Diagnosis Date   Allergy    Anxiety    Arthritis    Back muscle spasm    Chronic back pain    Diverticulosis    GERD (gastroesophageal reflux  disease)    IBS (irritable bowel syndrome)    Insomnia    Low blood monocyte count    Osteoporosis    Stroke (HCC) 08/2016    Past Surgical History:  Procedure Laterality Date   APPENDECTOMY     CHOLECYSTECTOMY     knee cast     left 2015   uterine tumors      Facility-Administered Medications Prior to Admission  Medication Dose Route Frequency Provider Last Rate Last Admin   iopamidol (ISOVUE-M) 41 % intrathecal injection 20 mL  20 mL Other Once PRN Yevette Edwards, MD       iopamidol (ISOVUE-M) 41 % intrathecal injection 20 mL  20 mL Other Once PRN Yevette Edwards, MD       lactated ringers infusion 1,000 mL  1,000 mL Intravenous Continuous Yevette Edwards, MD       lactated ringers infusion 1,000 mL  1,000 mL Intravenous Continuous Yevette Edwards, MD       lidocaine (PF) (XYLOCAINE) 1 % injection 5 mL  5 mL Subcutaneous Once Yevette Edwards, MD       lidocaine (PF) (XYLOCAINE) 1 % injection 5 mL  5 mL Subcutaneous Once Yevette Edwards, MD       midazolam (VERSED) 5 MG/5ML injection 5 mg  5 mg Intravenous Once Yevette Edwards, MD       midazolam (VERSED) injection 5 mg  5 mg Intravenous Once Yevette Edwards, MD       ropivacaine (PF) 2 mg/mL (0.2%) (NAROPIN) injection  10 mL  10 mL Epidural Once Yevette EdwardsAdams, James G, MD       ropivacaine (PF) 2 mg/mL (0.2%) (NAROPIN) injection 10 mL  10 mL Epidural Once Yevette EdwardsAdams, James G, MD       sodium chloride flush (NS) 0.9 % injection 10 mL  10 mL Other Once Yevette EdwardsAdams, James G, MD       sodium chloride flush (NS) 0.9 % injection 10 mL  10 mL Other Once Yevette EdwardsAdams, James G, MD       triamcinolone acetonide (KENALOG-40) injection 40 mg  40 mg Other Once Yevette EdwardsAdams, James G, MD       triamcinolone acetonide (KENALOG-40) injection 40 mg  40 mg Other Once Yevette EdwardsAdams, James G, MD       Medications Prior to Admission  Medication Sig Dispense Refill Last Dose   atorvastatin (LIPITOR) 20 MG tablet Take 20 mg by mouth daily.   04/15/2020 at 0700   Cholecalciferol  (VITAMIN D-1000 MAX ST) 1000 units tablet Take 1,000 mg by mouth daily.   04/15/2020 at Unknown time   diphenhydrAMINE (BENADRYL) 25 mg capsule Take 25 mg by mouth at bedtime as needed for allergies.    PRN at PRN   DULoxetine (CYMBALTA) 60 MG capsule Take 60 mg by mouth daily.  5 04/15/2020 at 0700   pantoprazole (PROTONIX) 40 MG tablet Take 1 tablet (40 mg total) by mouth daily. (Patient taking differently: Take 40 mg by mouth 2 (two) times daily. ) 30 tablet 0 04/15/2020 at 0700   predniSONE (DELTASONE) 50 MG tablet Take 1 tablet (50 mg total) by mouth daily with breakfast. 5 tablet 0 04/15/2020 at 0700   traMADol (ULTRAM) 50 MG tablet Take 1 tablet (50 mg total) by mouth 2 (two) times daily as needed. 30 tablet 5 Past Week at PRN   traZODone (DESYREL) 100 MG tablet Take 100 mg by mouth at bedtime as needed for sleep.   11 04/14/2020 at 2100   latanoprost (XALATAN) 0.005 % ophthalmic solution Place 1 drop into both eyes at bedtime.      Social History   Socioeconomic History   Marital status: Married    Spouse name: Not on file   Number of children: Not on file   Years of education: Not on file   Highest education level: Not on file  Occupational History   Not on file  Tobacco Use   Smoking status: Former Smoker   Smokeless tobacco: Never Used  Substance and Sexual Activity   Alcohol use: No   Drug use: No   Sexual activity: Not on file  Other Topics Concern   Not on file  Social History Narrative   Lives at home with husband. Independent and considers herself active   Social Determinants of Corporate investment bankerHealth   Financial Resource Strain:    Difficulty of Paying Living Expenses:   Food Insecurity:    Worried About Programme researcher, broadcasting/film/videounning Out of Food in the Last Year:    Baristaan Out of Food in the Last Year:   Transportation Needs:    Freight forwarderLack of Transportation (Medical):    Lack of Transportation (Non-Medical):   Physical Activity:    Days of Exercise per Week:    Minutes of Exercise  per Session:   Stress:    Feeling of Stress :   Social Connections:    Frequency of Communication with Friends and Family:    Frequency of Social Gatherings with Friends and Family:    Attends Religious Services:  Active Member of Clubs or Organizations:    Attends Engineer, structural:    Marital Status:   Intimate Partner Violence:    Fear of Current or Ex-Partner:    Emotionally Abused:    Physically Abused:    Sexually Abused:     Family History  Problem Relation Age of Onset   GI problems Mother    Cancer Father       Review of systems complete and found to be negative unless listed above      PHYSICAL EXAM  General: Well developed, well nourished, in no acute distress, sitting up in bed, appears comfortable HEENT:  Normocephalic and atramatic Neck:  No JVD.  Lungs: normal effort of breathing at rest on room air, bibasilar rhonchi Heart: HRRR . Normal S1 and S2 without gallops or murmurs.  Abdomen: nondistended Msk:  Back normal, gait not assessed. No obvious deformities. Extremities: No clubbing, cyanosis or edema.   Neuro: Alert and oriented X 3. Psych:  Good affect, responds appropriately  Labs:   Lab Results  Component Value Date   WBC 6.4 04/16/2020   HGB 13.7 04/16/2020   HCT 38.8 04/16/2020   MCV 88.0 04/16/2020   PLT 242 04/16/2020    Recent Labs  Lab 04/16/20 0428  NA 139  K 3.7  CL 101  CO2 25  BUN 18  CREATININE 1.02*  CALCIUM 9.0  GLUCOSE 149*   Lab Results  Component Value Date   CKTOTAL 47 01/05/2009   CKMB 0.6 01/05/2009   TROPONINI <0.03 06/06/2017    Lab Results  Component Value Date   CHOL 212 (H) 08/12/2016   CHOL  01/05/2009    183        ATP III CLASSIFICATION:  <200     mg/dL   Desirable  456-256  mg/dL   Borderline High  >=389    mg/dL   High          Lab Results  Component Value Date   HDL 77 08/12/2016   HDL 64 01/05/2009   Lab Results  Component Value Date   LDLCALC 127 (H)  08/12/2016   LDLCALC (H) 01/05/2009    105        Total Cholesterol/HDL:CHD Risk Coronary Heart Disease Risk Table                     Men   Women  1/2 Average Risk   3.4   3.3  Average Risk       5.0   4.4  2 X Average Risk   9.6   7.1  3 X Average Risk  23.4   11.0        Use the calculated Patient Ratio above and the CHD Risk Table to determine the patient's CHD Risk.        ATP III CLASSIFICATION (LDL):  <100     mg/dL   Optimal  373-428  mg/dL   Near or Above                    Optimal  130-159  mg/dL   Borderline  768-115  mg/dL   High  >726     mg/dL   Very High   Lab Results  Component Value Date   TRIG 42 08/12/2016   TRIG 71 01/05/2009   Lab Results  Component Value Date   CHOLHDL 2.8 08/12/2016   CHOLHDL 2.9 01/05/2009   No results found  for: LDLDIRECT    Radiology: DG Chest 2 View  Result Date: 04/15/2020 CLINICAL DATA:  Worsening shortness of breath, chest pain EXAM: CHEST - 2 VIEW COMPARISON:  04/13/2020 FINDINGS: Frontal and lateral views of the chest demonstrate stable background scarring and fibrosis. No airspace disease, effusion, or pneumothorax. Cardiac silhouette is stable. IMPRESSION: 1. Stable scarring and fibrosis.  No acute process. Electronically Signed   By: Sharlet Salina M.D.   On: 04/15/2020 15:06   DG Chest 2 View  Result Date: 04/13/2020 CLINICAL DATA:  Chest pain. Additional provided: Increased shortness of breath over the past "couple of days" with left-sided chest pain/pressure, dry cough EXAM: CHEST - 2 VIEW COMPARISON:  CT angiogram chest 01/05/2009 FINDINGS: Heart size within normal limits. No appreciable airspace consolidation within the lungs. No frank pulmonary edema. No evidence of pleural effusion or pneumothorax. There is a 1.2 cm somewhat spiculated opacity which projects in the region of the right lung apex. Moderate to severe age-indeterminate midthoracic vertebral compression deformity. These results were called by telephone at  the time of interpretation on 04/13/2020 at 9:58 am to provider Shaune Pollack , who verbally acknowledged these results. IMPRESSION: 1.2 cm spiculated opacity projecting in the region of the right lung apex suspicious for a lung nodule. CT chest is recommended for further evaluation. No appreciable airspace consolidation or frank pulmonary edema. Age-indeterminate moderate to severe midthoracic vertebral compression deformity. Electronically Signed   By: Jackey Loge DO   On: 04/13/2020 09:59   CT Angio Chest PE W and/or Wo Contrast  Result Date: 04/13/2020 CLINICAL DATA:  Cough, shortness of breath, chest pain, history of DVT, former smoker EXAM: CT ANGIOGRAPHY CHEST WITH CONTRAST TECHNIQUE: Multidetector CT imaging of the chest was performed using the standard protocol during bolus administration of intravenous contrast. Multiplanar CT image reconstructions and MIPs were obtained to evaluate the vascular anatomy. CONTRAST:  49mL OMNIPAQUE IOHEXOL 350 MG/ML SOLN COMPARISON:  01/05/2009 FINDINGS: Cardiovascular: Satisfactory opacification of the pulmonary arteries to the segmental level. No evidence of pulmonary embolism. Normal heart size. Left coronary artery calcifications. No pericardial effusion. Scattered aortic atherosclerosis. Mediastinum/Nodes: No enlarged mediastinal, hilar, or axillary lymph nodes. Thyroid gland, trachea, and esophagus demonstrate no significant findings. Lungs/Pleura: There is mild pulmonary fibrosis in a pattern with no clear apical to basal gradient featuring predominantly subpleural peripheral interstitial opacity and some very fine nodular quality, particularly at the lung apices (series 6, image 20, 67). These findings appear to be slightly worsened in comparison to remote prior CT dated 01/05/2009. No pleural effusion or pneumothorax. Upper Abdomen: No acute abnormality. Musculoskeletal: No chest wall abnormality. Age indeterminate wedge deformity of the T8 vertebral body.  Review of the MIP images confirms the above findings. IMPRESSION: 1. Negative examination for pulmonary embolism. 2. Mild pulmonary fibrosis in a pattern with no clear apical to basal gradient featuring predominantly subpleural peripheral interstitial opacity and some very fine nodular quality. Findings are in keeping with an "alternative diagnosis" pattern of fibrosis by ATS pulmonary fibrosis criteria, general differential considerations including hypersensitivity pneumonitis and sarcoidosis. Consider pulmonary referral and ILD protocol CT follow-up to assess for stability. 3. Age indeterminate wedge deformity of the T8 vertebral body. 4. Coronary artery disease.  Aortic Atherosclerosis (ICD10-I70.0). Electronically Signed   By: Lauralyn Primes M.D.   On: 04/13/2020 13:08   US Venous Img Lower Bilateral (DVT)  Result Date: 04/16/2020 CLINICAL DATA:  74 year old female with bilateral leg pain. History of clotting disorder factor 5 Leiden. EXAM: BILATERAL LOWER  EXTREMITY VENOUS DOPPLER ULTRASOUND TECHNIQUE: Gray-scale sonography with graded compression, as well as color Doppler and duplex ultrasound were performed to evaluate the lower extremity deep venous systems from the level of the common femoral vein and including the common femoral, femoral, profunda femoral, popliteal and calf veins including the posterior tibial, peroneal and gastrocnemius veins when visible. The superficial great saphenous vein was also interrogated. Spectral Doppler was utilized to evaluate flow at rest and with distal augmentation maneuvers in the common femoral, femoral and popliteal veins. COMPARISON:  None. FINDINGS: RIGHT LOWER EXTREMITY Common Femoral Vein: No evidence of thrombus. Normal compressibility, respiratory phasicity and response to augmentation. Saphenofemoral Junction: No evidence of thrombus. Normal compressibility and flow on color Doppler imaging. Profunda Femoral Vein: No evidence of thrombus. Normal compressibility  and flow on color Doppler imaging. Femoral Vein: No evidence of thrombus. Normal compressibility, respiratory phasicity and response to augmentation. Popliteal Vein: No evidence of thrombus. Normal compressibility, respiratory phasicity and response to augmentation. Calf Veins: No evidence of thrombus. Normal compressibility and flow on color Doppler imaging. Other Findings:  None. LEFT LOWER EXTREMITY Common Femoral Vein: No evidence of thrombus. Normal compressibility, respiratory phasicity and response to augmentation. Saphenofemoral Junction: No evidence of thrombus. Normal compressibility and flow on color Doppler imaging. Profunda Femoral Vein: No evidence of thrombus. Normal compressibility and flow on color Doppler imaging. Femoral Vein: No evidence of thrombus. Normal compressibility, respiratory phasicity and response to augmentation. Popliteal Vein: No evidence of thrombus. Normal compressibility, respiratory phasicity and response to augmentation. Calf Veins: No evidence of thrombus. Normal compressibility and flow on color Doppler imaging. Other Findings:  None. IMPRESSION: No evidence of deep venous thrombosis in either lower extremity. Electronically Signed   By: Odessa Fleming M.D.   On: 04/16/2020 00:43    EKG: sinus rhythm, nonspecific T wave abnormalities, no dynamic changes  ASSESSMENT AND PLAN:  1. Acute exertional dyspnea and cough, with chest CT negative for PE, with findings suggestive of pulmonary fibrosis. Started on antibiotic, duo nebs, Solu-Medrol. Appears euvolemic. Pulmonary consult placed. 2. Elevated BNP of 160, appears euvolemic. Denies chest pain, peripheral edema, or orthopnea. Oxygen saturation 100% on room air at rest.    Plan: 1. Agree with current plan 2. Obtain 2D echocardiogram 3. Further recommendations pending results of echocardiogram and patient's initial course.  Signed: Leanora Ivanoff PA-C 04/16/2020, 8:27 AM  Discussed with Dr. Darrold Junker who agrees with  plan.

## 2020-04-17 LAB — RESPIRATORY PANEL BY PCR

## 2020-04-17 LAB — BASIC METABOLIC PANEL
Anion gap: 9 (ref 5–15)
BUN: 27 mg/dL — ABNORMAL HIGH (ref 8–23)
CO2: 28 mmol/L (ref 22–32)
Calcium: 8.9 mg/dL (ref 8.9–10.3)
Chloride: 101 mmol/L (ref 98–111)
Creatinine, Ser: 1.06 mg/dL — ABNORMAL HIGH (ref 0.44–1.00)
GFR calc Af Amer: 60 mL/min — ABNORMAL LOW (ref >60)
GFR calc non Af Amer: 52 mL/min — ABNORMAL LOW (ref >60)
Glucose, Bld: 139 mg/dL — ABNORMAL HIGH (ref 70–99)
Potassium: 3.9 mmol/L (ref 3.5–5.1)
Sodium: 138 mmol/L (ref 135–145)

## 2020-04-17 LAB — CBC WITH DIFFERENTIAL/PLATELET
Abs Immature Granulocytes: 0.06 10*3/uL (ref 0.00–0.07)
Basophils Absolute: 0 10*3/uL (ref 0.0–0.1)
Basophils Relative: 0 %
Eosinophils Absolute: 0 10*3/uL (ref 0.0–0.5)
Eosinophils Relative: 0 %
HCT: 38.9 % (ref 36.0–46.0)
Hemoglobin: 13.1 g/dL (ref 12.0–15.0)
Immature Granulocytes: 1 %
Lymphocytes Relative: 8 %
Lymphs Abs: 0.8 10*3/uL (ref 0.7–4.0)
MCH: 30.7 pg (ref 26.0–34.0)
MCHC: 33.7 g/dL (ref 30.0–36.0)
MCV: 91.1 fL (ref 80.0–100.0)
Monocytes Absolute: 0.4 10*3/uL (ref 0.1–1.0)
Monocytes Relative: 5 %
Neutro Abs: 8.4 10*3/uL — ABNORMAL HIGH (ref 1.7–7.7)
Neutrophils Relative %: 86 %
Platelets: 247 10*3/uL (ref 150–400)
RBC: 4.27 MIL/uL (ref 3.87–5.11)
RDW: 14 % (ref 11.5–15.5)
WBC: 9.7 10*3/uL (ref 4.0–10.5)
nRBC: 0 % (ref 0.0–0.2)

## 2020-04-17 LAB — MAGNESIUM: Magnesium: 1.9 mg/dL (ref 1.7–2.4)

## 2020-04-17 LAB — GLUCOSE, CAPILLARY
Glucose-Capillary: 121 mg/dL — ABNORMAL HIGH (ref 70–99)
Glucose-Capillary: 103 mg/dL — ABNORMAL HIGH (ref 70–99)
Glucose-Capillary: 160 mg/dL — ABNORMAL HIGH (ref 70–99)
Glucose-Capillary: 127 mg/dL — ABNORMAL HIGH (ref 70–99)

## 2020-04-17 LAB — FIBRIN DERIVATIVES D-DIMER (ARMC ONLY): Fibrin derivatives D-dimer (ARMC): 204.27 ng/mL (FEU) (ref 0.00–499.00)

## 2020-04-17 LAB — LIPID PANEL
Cholesterol: 185 mg/dL (ref 0–200)
HDL: 82 mg/dL (ref >40)
LDL Cholesterol: 92 mg/dL (ref 0–99)
Total CHOL/HDL Ratio: 2.3 RATIO
Triglycerides: 57 mg/dL (ref <150)
VLDL: 11 mg/dL (ref 0–40)

## 2020-04-17 LAB — SEDIMENTATION RATE: Sed Rate: 4 mm/hr (ref 0–30)

## 2020-04-17 MED ORDER — PREDNISONE 20 MG PO TABS
40.0000 mg | ORAL_TABLET | Freq: Every day | ORAL | Status: DC
  Administered 2020-04-18: 40 mg via ORAL
  Filled 2020-04-17: qty 2

## 2020-04-17 MED ORDER — LORAZEPAM 1 MG PO TABS
1.0000 mg | ORAL_TABLET | Freq: Three times a day (TID) | ORAL | Status: DC | PRN
  Administered 2020-04-17 (×2): 1 mg via ORAL
  Filled 2020-04-17 (×2): qty 1

## 2020-04-17 NOTE — Consult Note (Signed)
Pulmonary Medicine          Date: 04/17/2020,   MRN# [id number] Ashley Murillo [birth year]     AdmissionWeight: 58.1 kg                 CurrentWeight: 58.1 kg      CHIEF COMPLAINT:   SOB/DOE   HISTORY OF PRESENT ILLNESS   As per admission H&P Ashley Murillo  is a 74 y.o. Caucasian female with a known history of dyslipidemia, factor V Leiden, chronic back pain, osteoporosis, CVA and anxiety, who presented to the emergency room with acute onset worsening dyspnea since Monday with associated dry cough without significant wheezing.   She denies any fever or chills.  No nausea or vomiting or abdominal pain.  She was seen in the ER 2 days prior to admission and had a negative chest CT for PE but it did show suspected interstitial pulmonary fibrosis that she was not aware of.  She got vaccinated for COVID-19 with 1 dose of [employer] in April.  She denies any chest pain or palpitations.  She admitted to bilateral lower extremity pain since last night.  She stated that she gets a DVT about every 10 years.  She is not on any anticoagulants for factor V Leiden.  Upon presentation to the emergency room, blood pressure was 156/91 with otherwise normal vital signs.  Her pulse oximetry has been 100% on room air.  Labs revealed borderline potassium of 3.5 with otherwise normal BMP.  Her BNP was 160.9 and high-sensitivity troponin I of 4 and later the same.  CBC was within normal.  COVID-19 PCR came back negative..  Two-view chest x-ray showed stable scarring and fibrosis with no acute process.  EKG showed normal sinus rhythm with rate of 66 with nonspecific T wave abnormalities.  Patient smoked from age 1 to 78 only.  She has subpleural fibrotic changes on CT chest which is mild. She has heterozygous FactorV leiden. She has lost 15lbs in last few months and complains of fatigue. These may be due to occult malignancy vs possible grieving related depression associated weight loss.       PAST MEDICAL HISTORY   Past Medical History:  Diagnosis Date  . Allergy   . Anxiety   . Arthritis   . Back muscle spasm   . Chronic back pain   . Diverticulosis   . GERD (gastroesophageal reflux disease)   . IBS (irritable bowel syndrome)   . Insomnia   . Low blood monocyte count   . Osteoporosis   . Stroke [facility]) 08/2016     SURGICAL HISTORY   Past Surgical History:  Procedure Laterality Date  . APPENDECTOMY    . CHOLECYSTECTOMY    . knee cast     left 2015  . uterine tumors       FAMILY HISTORY   Family History  Problem Relation Age of Onset  . GI problems Mother   . Cancer Father      SOCIAL HISTORY   Social History   Tobacco Use  . Smoking status: Former Games developer  . Smokeless tobacco: Never Used  Substance Use Topics  . Alcohol use: No  . Drug use: No     MEDICATIONS    Home Medication:    Current Medication:  Current Facility-Administered Medications:  .  0.9 %  sodium chloride infusion, 250 mL, Intravenous, PRN, Mansy, Vernetta Honey, MD, Stopped at 04/16/20 2015 .  acetaminophen (TYLENOL) tablet 650  mg, 650 mg, Oral, Q6H PRN, 650 mg at 04/16/20 0900 **OR** acetaminophen (TYLENOL) suppository 650 mg, 650 mg, Rectal, Q6H PRN, Mansy, Jan A, MD .  atorvastatin (LIPITOR) tablet 20 mg, 20 mg, Oral, Daily, Mansy, Jan A, MD, 20 mg at 04/16/20 1713 .  azithromycin (ZITHROMAX) tablet 500 mg, 500 mg, Oral, Daily, Mansy, Jan A, MD, 500 mg at 04/16/20 2011 .  cefTRIAXone (ROCEPHIN) 1 g in sodium chloride 0.9 % 100 mL IVPB, 1 g, Intravenous, Q24H, Mansy, Vernetta HoneyJan A, MD, Stopped at 04/16/20 2045 .  chlorpheniramine-HYDROcodone (TUSSIONEX) 10-8 MG/5ML suspension 5 mL, 5 mL, Oral, Q12H PRN, Mansy, Jan A, MD, 5 mL at 04/16/20 0239 .  cholecalciferol (VITAMIN D3) tablet 1,000 Units, 1,000 Units, Oral, Daily, Mansy, Vernetta HoneyJan A, MD, 1,000 Units at 04/17/20 0846 .  diphenhydrAMINE (BENADRYL) capsule 25 mg, 25 mg, Oral, QHS PRN, Mansy, Jan A, MD .  DULoxetine (CYMBALTA) DR  capsule 60 mg, 60 mg, Oral, Daily, Mansy, Jan A, MD, 60 mg at 04/17/20 0847 .  enoxaparin (LOVENOX) injection 40 mg, 40 mg, Subcutaneous, Q24H, Mansy, Jan A, MD, 40 mg at 04/17/20 0245 .  guaiFENesin (MUCINEX) 12 hr tablet 600 mg, 600 mg, Oral, BID, Mansy, Jan A, MD, 600 mg at 04/17/20 0846 .  insulin aspart (novoLOG) injection 0-9 Units, 0-9 Units, Subcutaneous, TID WC, Marrion CoyZhang, Dekui, MD, 1 Units at 04/17/20 0848 .  ipratropium-albuterol (DUONEB) 0.5-2.5 (3) MG/3ML nebulizer solution 3 mL, 3 mL, Nebulization, QID, Mansy, Jan A, MD, 3 mL at 04/17/20 0749 .  magnesium hydroxide (MILK OF MAGNESIA) suspension 30 mL, 30 mL, Oral, Daily PRN, Mansy, Jan A, MD .  methylPREDNISolone sodium succinate (SOLU-MEDROL) 125 mg/2 mL injection 125 mg, 125 mg, Intravenous, Q8H, Marrion CoyZhang, Dekui, MD, 125 mg at 04/17/20 0847 .  ondansetron (ZOFRAN) tablet 4 mg, 4 mg, Oral, Q6H PRN **OR** ondansetron (ZOFRAN) injection 4 mg, 4 mg, Intravenous, Q6H PRN, Mansy, Jan A, MD, 4 mg at 04/16/20 2010 .  pantoprazole (PROTONIX) EC tablet 40 mg, 40 mg, Oral, BID, Mansy, Jan A, MD, 40 mg at 04/17/20 0846 .  sodium chloride flush (NS) 0.9 % injection 3 mL, 3 mL, Intravenous, Q12H, Mansy, Jan A, MD, 3 mL at 04/17/20 0849 .  sodium chloride flush (NS) 0.9 % injection 3 mL, 3 mL, Intravenous, PRN, Mansy, Jan A, MD .  traMADol Janean Sark(ULTRAM) tablet 50 mg, 50 mg, Oral, BID PRN, Mansy, Jan A, MD, 50 mg at 04/16/20 1336 .  traZODone (DESYREL) tablet 100 mg, 100 mg, Oral, QHS PRN, Mansy, Jan A, MD, 100 mg at 04/16/20 2133    ALLERGIES   Sulfa antibiotics, Tape, and Latex     REVIEW OF SYSTEMS    Review of Systems:  Gen:  Denies  fever, sweats, chills weigh loss  HEENT: Denies blurred vision, double vision, ear pain, eye pain, hearing loss, nose bleeds, sore throat Cardiac:  No dizziness, chest pain or heaviness, chest tightness,edema Resp:   Denies cough or sputum porduction, shortness of breath,wheezing, hemoptysis,  Gi: Denies  swallowing difficulty, stomach pain, nausea or vomiting, diarrhea, constipation, bowel incontinence Gu:  Denies bladder incontinence, burning urine Ext:   Denies Joint pain, stiffness or swelling Skin: Denies  skin rash, easy bruising or bleeding or hives Endoc:  Denies polyuria, polydipsia , polyphagia or weight change Psych:   Denies depression, insomnia or hallucinations   Other:  All other systems negative   VS: BP (!) 143/91 (BP Location: Left Arm)   Pulse 92   Temp  98.2 F (36.8 C) (Oral)   Resp 18   Ht 5' (1.524 m)   Wt 58.1 kg   SpO2 100%   BMI 25.00 kg/m      PHYSICAL EXAM    GENERAL:NAD, no fevers, chills, no weakness no fatigue HEAD: Normocephalic, atraumatic.  EYES: Pupils equal, round, reactive to light. Extraocular muscles intact. No scleral icterus.  MOUTH: Moist mucosal membrane. Dentition intact. No abscess noted.  EAR, NOSE, THROAT: Clear without exudates. No external lesions.  NECK: Supple. No thyromegaly. No nodules. No JVD.  PULMONARY: Diffuse coarse rhonchi right sided +wheezes CARDIOVASCULAR: S1 and S2. Regular rate and rhythm. No murmurs, rubs, or gallops. No edema. Pedal pulses 2+ bilaterally.  GASTROINTESTINAL: Soft, nontender, nondistended. No masses. Positive bowel sounds. No hepatosplenomegaly.  MUSCULOSKELETAL: No swelling, clubbing, or edema. Range of motion full in all extremities.  NEUROLOGIC: Cranial nerves II through XII are intact. No gross focal neurological deficits. Sensation intact. Reflexes intact.  SKIN: No ulceration, lesions, rashes, or cyanosis. Skin warm and dry. Turgor intact.  PSYCHIATRIC: Mood, affect within normal limits. The patient is awake, alert and oriented x 3. Insight, judgment intact.       IMAGING    DG Chest 2 View  Result Date: 04/15/2020 CLINICAL DATA:  Worsening shortness of breath, chest pain EXAM: CHEST - 2 VIEW COMPARISON:  04/13/2020 FINDINGS: Frontal and lateral views of the chest demonstrate stable  background scarring and fibrosis. No airspace disease, effusion, or pneumothorax. Cardiac silhouette is stable. IMPRESSION: 1. Stable scarring and fibrosis.  No acute process. Electronically Signed   By: Sharlet Salina M.D.   On: 04/15/2020 15:06   DG Chest 2 View  Result Date: 04/13/2020 CLINICAL DATA:  Chest pain. Additional provided: Increased shortness of breath over the past "couple of days" with left-sided chest pain/pressure, dry cough EXAM: CHEST - 2 VIEW COMPARISON:  CT angiogram chest 01/05/2009 FINDINGS: Heart size within normal limits. No appreciable airspace consolidation within the lungs. No frank pulmonary edema. No evidence of pleural effusion or pneumothorax. There is a 1.2 cm somewhat spiculated opacity which projects in the region of the right lung apex. Moderate to severe age-indeterminate midthoracic vertebral compression deformity. These results were called by telephone at the time of interpretation on 04/13/2020 at 9:58 am to provider Shaune Pollack , who verbally acknowledged these results. IMPRESSION: 1.2 cm spiculated opacity projecting in the region of the right lung apex suspicious for a lung nodule. CT chest is recommended for further evaluation. No appreciable airspace consolidation or frank pulmonary edema. Age-indeterminate moderate to severe midthoracic vertebral compression deformity. Electronically Signed   By: Jackey Loge DO   On: 04/13/2020 09:59   CT HEAD WO CONTRAST  Result Date: 04/16/2020 CLINICAL DATA:  Headache EXAM: CT HEAD WITHOUT CONTRAST TECHNIQUE: Contiguous axial images were obtained from the base of the skull through the vertex without intravenous contrast. COMPARISON:  CT head 08/12/2016 FINDINGS: Brain: No evidence of acute infarction, hemorrhage, hydrocephalus, extra-axial collection or mass lesion/mass effect. Mild atrophy consistent with age. Vascular: Negative for hyperdense vessel Skull: Negative Sinuses/Orbits: Paranasal sinuses clear. No orbital  lesion. Bilateral cataract extraction. Other: None IMPRESSION: Negative for age.  No acute abnormality Electronically Signed   By: Marlan Palau M.D.   On: 04/16/2020 14:29   CT Angio Chest PE W and/or Wo Contrast  Result Date: 04/13/2020 CLINICAL DATA:  Cough, shortness of breath, chest pain, history of DVT, former smoker EXAM: CT ANGIOGRAPHY CHEST WITH CONTRAST TECHNIQUE: Multidetector CT imaging of  the chest was performed using the standard protocol during bolus administration of intravenous contrast. Multiplanar CT image reconstructions and MIPs were obtained to evaluate the vascular anatomy. CONTRAST:  77mL OMNIPAQUE IOHEXOL 350 MG/ML SOLN COMPARISON:  01/05/2009 FINDINGS: Cardiovascular: Satisfactory opacification of the pulmonary arteries to the segmental level. No evidence of pulmonary embolism. Normal heart size. Left coronary artery calcifications. No pericardial effusion. Scattered aortic atherosclerosis. Mediastinum/Nodes: No enlarged mediastinal, hilar, or axillary lymph nodes. Thyroid gland, trachea, and esophagus demonstrate no significant findings. Lungs/Pleura: There is mild pulmonary fibrosis in a pattern with no clear apical to basal gradient featuring predominantly subpleural peripheral interstitial opacity and some very fine nodular quality, particularly at the lung apices (series 6, image 20, 67). These findings appear to be slightly worsened in comparison to remote prior CT dated 01/05/2009. No pleural effusion or pneumothorax. Upper Abdomen: No acute abnormality. Musculoskeletal: No chest wall abnormality. Age indeterminate wedge deformity of the T8 vertebral body. Review of the MIP images confirms the above findings. IMPRESSION: 1. Negative examination for pulmonary embolism. 2. Mild pulmonary fibrosis in a pattern with no clear apical to basal gradient featuring predominantly subpleural peripheral interstitial opacity and some very fine nodular quality. Findings are in keeping with an  "alternative diagnosis" pattern of fibrosis by ATS pulmonary fibrosis criteria, general differential considerations including hypersensitivity pneumonitis and sarcoidosis. Consider pulmonary referral and ILD protocol CT follow-up to assess for stability. 3. Age indeterminate wedge deformity of the T8 vertebral body. 4. Coronary artery disease.  Aortic Atherosclerosis (ICD10-I70.0). Electronically Signed   By: Lauralyn Primes M.D.   On: 04/13/2020 13:08   US Venous Img Lower Bilateral (DVT)  Result Date: 04/16/2020 CLINICAL DATA:  74 year old female with bilateral leg pain. History of clotting disorder factor 5 Leiden. EXAM: BILATERAL LOWER EXTREMITY VENOUS DOPPLER ULTRASOUND TECHNIQUE: Gray-scale sonography with graded compression, as well as color Doppler and duplex ultrasound were performed to evaluate the lower extremity deep venous systems from the level of the common femoral vein and including the common femoral, femoral, profunda femoral, popliteal and calf veins including the posterior tibial, peroneal and gastrocnemius veins when visible. The superficial great saphenous vein was also interrogated. Spectral Doppler was utilized to evaluate flow at rest and with distal augmentation maneuvers in the common femoral, femoral and popliteal veins. COMPARISON:  None. FINDINGS: RIGHT LOWER EXTREMITY Common Femoral Vein: No evidence of thrombus. Normal compressibility, respiratory phasicity and response to augmentation. Saphenofemoral Junction: No evidence of thrombus. Normal compressibility and flow on color Doppler imaging. Profunda Femoral Vein: No evidence of thrombus. Normal compressibility and flow on color Doppler imaging. Femoral Vein: No evidence of thrombus. Normal compressibility, respiratory phasicity and response to augmentation. Popliteal Vein: No evidence of thrombus. Normal compressibility, respiratory phasicity and response to augmentation. Calf Veins: No evidence of thrombus. Normal compressibility  and flow on color Doppler imaging. Other Findings:  None. LEFT LOWER EXTREMITY Common Femoral Vein: No evidence of thrombus. Normal compressibility, respiratory phasicity and response to augmentation. Saphenofemoral Junction: No evidence of thrombus. Normal compressibility and flow on color Doppler imaging. Profunda Femoral Vein: No evidence of thrombus. Normal compressibility and flow on color Doppler imaging. Femoral Vein: No evidence of thrombus. Normal compressibility, respiratory phasicity and response to augmentation. Popliteal Vein: No evidence of thrombus. Normal compressibility, respiratory phasicity and response to augmentation. Calf Veins: No evidence of thrombus. Normal compressibility and flow on color Doppler imaging. Other Findings:  None. IMPRESSION: No evidence of deep venous thrombosis in either lower extremity. Electronically Signed   By:  H  Hall M.Odessa Fleming On: 04/16/2020 00:43   ECHOCARDIOGRAM COMPLETE  Result Date: 04/16/2020    ECHOCARDIOGRAM REPORT   Patient Name:   TAKEESHA ISLEY Date of Exam: 04/16/2020 Medical Rec #:  [id number]       Height:       60.0 in Accession #:    [id number]      Weight:       128.0 lb Date of Birth:  17-Jan-1946       BSA:          1.544 m Patient Age:    74 years        BP:           135/73 mmHg Patient Gender: F               HR:           80 bpm. Exam Location:  [facility] Procedure: 2D Echo, Cardiac Doppler and Color Doppler Indications:     Dyspnea 786.09  History:         Patient has prior history of Echocardiogram examinations, most                  recent 08/13/2016. Stroke.  Sonographer:     Cristela Blue RDCS (AE) Referring Phys:  [id number] Vernetta Honey MANSY Diagnosing Phys: Marcina Millard MD IMPRESSIONS  1. Left ventricular ejection fraction, by estimation, is 60 to 65%. The left ventricle has normal function. The left ventricle has no regional wall motion abnormalities. Left ventricular diastolic parameters are consistent with Grade I diastolic dysfunction (impaired  relaxation).  2. Right ventricular systolic function is normal. The right ventricular size is normal. There is normal pulmonary artery systolic pressure.  3. The mitral valve is normal in structure. Mild mitral valve regurgitation. No evidence of mitral stenosis.  4. The aortic valve is normal in structure. Aortic valve regurgitation is mild. No aortic stenosis is present.  5. The inferior vena cava is normal in size with greater than 50% respiratory variability, suggesting right atrial pressure of 3 mmHg. FINDINGS  Left Ventricle: Left ventricular ejection fraction, by estimation, is 60 to 65%. The left ventricle has normal function. The left ventricle has no regional wall motion abnormalities. The left ventricular internal cavity size was normal in size. There is  no left ventricular hypertrophy. Left ventricular diastolic parameters are consistent with Grade I diastolic dysfunction (impaired relaxation). Right Ventricle: The right ventricular size is normal. No increase in right ventricular wall thickness. Right ventricular systolic function is normal. There is normal pulmonary artery systolic pressure. The tricuspid regurgitant velocity is 2.51 m/s, and  with an assumed right atrial pressure of 10 mmHg, the estimated right ventricular systolic pressure is 35.2 mmHg. Left Atrium: Left atrial size was normal in size. Right Atrium: Right atrial size was normal in size. Pericardium: There is no evidence of pericardial effusion. Mitral Valve: The mitral valve is normal in structure. Normal mobility of the mitral valve leaflets. Mild mitral valve regurgitation. No evidence of mitral valve stenosis. Tricuspid Valve: The tricuspid valve is normal in structure. Tricuspid valve regurgitation is mild . No evidence of tricuspid stenosis. Aortic Valve: The aortic valve is normal in structure. Aortic valve regurgitation is mild. No aortic stenosis is present. Aortic valve mean gradient measures 3.0 mmHg. Aortic valve peak  gradient measures 5.2 mmHg. Aortic valve area, by VTI measures 3.39 cm. Pulmonic Valve: The pulmonic valve was normal in structure. Pulmonic valve regurgitation is not visualized. No  evidence of pulmonic stenosis. Aorta: The aortic root is normal in size and structure. Venous: The inferior vena cava is normal in size with greater than 50% respiratory variability, suggesting right atrial pressure of 3 mmHg. IAS/Shunts: No atrial level shunt detected by color flow Doppler.  LEFT VENTRICLE PLAX 2D LVIDd:         4.56 cm  Diastology LVIDs:         3.24 cm  LV e' lateral:   8.38 cm/s LV PW:         1.06 cm  LV E/e' lateral: 6.7 LV IVS:        0.64 cm  LV e' medial:    5.87 cm/s LVOT diam:     2.00 cm  LV E/e' medial:  9.6 LV SV:         72 LV SV Index:   46 LVOT Area:     3.14 cm  RIGHT VENTRICLE RV Basal diam:  2.70 cm RV S prime:     18.00 cm/s TAPSE (M-mode): 3.6 cm LEFT ATRIUM             Index       RIGHT ATRIUM           Index LA diam:        4.00 cm 2.59 cm/m  RA Area:     15.60 cm LA Vol (A2C):   44.0 ml 28.50 ml/m RA Volume:   42.10 ml  27.26 ml/m LA Vol (A4C):   39.6 ml 25.65 ml/m LA Biplane Vol: 44.5 ml 28.82 ml/m  AORTIC VALVE                   PULMONIC VALVE AV Area (Vmax):    3.22 cm    PV Vmax:        0.64 m/s AV Area (Vmean):   2.61 cm    PV Peak grad:   1.7 mmHg AV Area (VTI):     3.39 cm    RVOT Peak grad: 2 mmHg AV Vmax:           114.00 cm/s AV Vmean:          83.450 cm/s AV VTI:            0.211 m AV Peak Grad:      5.2 mmHg AV Mean Grad:      3.0 mmHg LVOT Vmax:         117.00 cm/s LVOT Vmean:        69.200 cm/s LVOT VTI:          0.228 m LVOT/AV VTI ratio: 1.08  AORTA Ao Root diam: 3.30 cm MITRAL VALVE               TRICUSPID VALVE MV Area (PHT): 3.79 cm    TR Peak grad:   25.2 mmHg MV Decel Time: 200 msec    TR Vmax:        251.00 cm/s MV E velocity: 56.10 cm/s MV A velocity: 95.50 cm/s  SHUNTS MV E/A ratio:  0.59        Systemic VTI:  0.23 m                            Systemic Diam:  2.00 cm Marcina Millard MD Electronically signed by Marcina Millard MD Signature Date/Time: 04/16/2020/12:59:41 PM    Final      IMPRESSION: 1. Negative examination for pulmonary  embolism. 2. Mild pulmonary fibrosis in a pattern with no clear apical to basal gradient featuring predominantly subpleural peripheral interstitial opacity and some very fine nodular quality. Findings are in keeping with an "alternative diagnosis" pattern of fibrosis by ATS pulmonary fibrosis criteria, general differential considerations including hypersensitivity pneumonitis and sarcoidosis. Consider pulmonary referral and ILD protocol CT follow-up to assess for stability. 3. Age indeterminate wedge deformity of the T8 vertebral body. 4. Coronary artery disease.  Aortic Atherosclerosis (ICD10-I70.0).     Electronically Signed   By: Lauralyn Primes M.D.   On: 04/13/2020 13:08   ASSESSMENT/PLAN    Dyspnea with respiratory distress  - possibly related to LRTI - COVID negative   - will obtain RVP  - possible Acute exacerbation of Idiopathic pulmonary fibrosis - reviewed CT chest personally - atypical imaging for IPF -will obtain PFT with graph today   Interstitial lung disease with acute exacerbation   - patient responded well to steroids and antibiotics  - she has hx of Factor V leidin  -will need to have outpatient workup for autoimmune disease   Weight loss and undue fatigue   - possible constitutional symptoms  -she has night sweats, weight loss, severe fatigue   - may need to have PET ct for possible occult malingnacy - family hx + for cancer which was difficult to diagnose        Thank you for allowing me to participate in the care of this patient.   Patient/Family are satisfied with care plan and all questions have been answered.  This document was prepared using Dragon voice recognition software and may include unintentional dictation errors.     Vida Rigger, M.D.  Division  of Pulmonary & Critical Care Medicine  [facility]

## 2020-04-17 NOTE — Progress Notes (Signed)
FVC 2.42  FEV1 1.88 FEV1/FVC 77.4   Performed the spirometry after the duoneb treatment  FVC 2.31 FEV1 1.88 FEV1/FVC 81.5  Pt gave good effort. The hardcopy is in the chart.

## 2020-04-17 NOTE — Progress Notes (Signed)
PROGRESS NOTE    [name]  HBZ:[id number] DOB: 06/20/1946 DOA: 04/15/2020 PCP: Derinda Late, MD   Chief complaint.  Shortness of breath.  Brief Narrative:   [name] a74 y.o.Caucasian femalewith a known history of dyslipidemia, factor V Leiden, chronic back pain, osteoporosis, CVA and anxiety, who presented to the emergency room with acute onset worsening dyspnea since Monday with associated dry cough without significant wheezing.  She had a CT scan 2 days ago showed interstitial pulmonary fibrosis, no PE.  She is a placed on IV steroids and antibiotics for exacerbationof pulmonary fibrosis.  Cardiology and pulmonology consult is obtained.  7/16.  Patient still complaining intermittent shortness of breath.  Echocardiogram showed ejection fraction 60 to 65%, no pulmonary hypertension.  Reviewed the CT angiogram performed before admission again, interstitial changes are very mild.  Patient also has saturation of 100% on room air now.  Her short of breath most likely is due to anxiety.  Steroids decreased to 40 mg of prednisone daily.  Discontinue Rocephin.  Ativan added.   Assessment & Plan:   Active Problems:   Dyspnea   Acute exacerbation of idiopathic pulmonary fibrosis ([place])  #1.  Dyspnea. Patient may have some pulmonary fibrosis.  But her condition is mild.  No evidence of congestive heart failure.  Condition more consistent with anxiety.  Patient just lost her husband, has not been sleeping well, she has a lot of anxiety and depression.  We will add as needed Ativan. Patient family is adamant to see a pulmonologist.  [name] will see the patient today. She is most likely will be discharged home tomorrow.  2.  Chronic back pain. Continue symptomatic treatment.  DVT prophylaxis: Lovenox Code Status: Full Family Communication: Discussed with patient and his son in patient room for over 30 minutes, all questions answered. Disposition Plan:   Patient came  from:Home                                                                                                                            Anticipated d/c place: Home  Barriers to d/c OR conditions which need to be met to effect a safe d/c:   Consultants:   Cardiology and pulmonology  Procedures: None  Antimicrobials:  Zithromax.     Subjective: Patient had an episode of palpitation, headache and neck pain yesterday.  CT head did not show any acute changes.  Symptom was better after giving 1 mg Ativan. She continued to have intermittent shortness of breath, but her oxygen saturation is 100% on room air.  She has no cough.  But she also has palpitation. No fever chills.  Objective: Vitals:   04/17/20 0245 04/17/20 0749 04/17/20 0751 04/17/20 1159  BP: 131/72  (!) 143/91   Pulse: 85  92   Resp: 16  18   Temp: 98.6 F (37 C)  98.2 F (36.8 C)   TempSrc: Oral  Oral   SpO2: 98% 100% 100% 100%  Weight:  Height:        Intake/Output Summary (Last 24 hours) at 04/17/2020 1238 Last data filed at 04/16/2020 2100 Gross per 24 hour  Intake 100.11 ml  Output --  Net 100.11 ml   Filed Weights   04/15/20 1435  Weight: 58.1 kg    Examination:  General exam: Appears calm and comfortable  Respiratory system: Clear to auscultation. Respiratory effort normal. Cardiovascular system: S1 & S2 heard, RRR. No JVD, murmurs, rubs, gallops or clicks. No pedal edema. Gastrointestinal system: Abdomen is nondistended, soft and nontender. No organomegaly or masses felt. Normal bowel sounds heard. Central nervous system: Alert and oriented. No focal neurological deficits. Extremities: Symmetric 5 x 5 power. Skin: No rashes, lesions or ulcers Psychiatry: Appears anxious and depressed.    Data Reviewed: I have personally reviewed following labs and imaging studies  CBC: Recent Labs  Lab 04/13/20 1015 04/15/20 1501 04/16/20 0428 04/17/20 0359  WBC 4.4 6.5 6.4 9.7  NEUTROABS  --    --   --  8.4*  HGB 13.9 14.2 13.7 13.1  HCT 40.3 41.5 38.8 38.9  MCV 90.0 89.1 88.0 91.1  PLT 172 238 242 800   Basic Metabolic Panel: Recent Labs  Lab 04/13/20 1015 04/15/20 1501 04/16/20 0428 04/17/20 0359  NA 137 138 139 138  K 4.3 3.5 3.7 3.9  CL 104 102 101 101  CO2 28 25 25 28   GLUCOSE 117* 130* 149* 139*  BUN 13 18 18  27*  CREATININE 0.93 0.93 1.02* 1.06*  CALCIUM 9.2 9.7 9.0 8.9  MG  --   --   --  1.9   GFR: Estimated Creatinine Clearance: 37.1 mL/min (A) (by C-G formula based on SCr of 1.06 mg/dL (H)). Liver Function Tests: No results for input(s): AST, ALT, ALKPHOS, BILITOT, PROT, ALBUMIN in the last 168 hours. No results for input(s): LIPASE, AMYLASE in the last 168 hours. No results for input(s): AMMONIA in the last 168 hours. Coagulation Profile: No results for input(s): INR, PROTIME in the last 168 hours. Cardiac Enzymes: No results for input(s): CKTOTAL, CKMB, CKMBINDEX, TROPONINI in the last 168 hours. BNP (last 3 results) No results for input(s): PROBNP in the last 8760 hours. HbA1C: Recent Labs    04/16/20 0428  HGBA1C 5.6   CBG: Recent Labs  Lab 04/16/20 1358 04/16/20 1633 04/16/20 2125 04/17/20 0752 04/17/20 1139  GLUCAP 90 123* 156* 121* 103*   Lipid Profile: Recent Labs    04/17/20 0359  CHOL 185  HDL 82  LDLCALC 92  TRIG 57  CHOLHDL 2.3   Thyroid Function Tests: No results for input(s): TSH, T4TOTAL, FREET4, T3FREE, THYROIDAB in the last 72 hours. Anemia Panel: No results for input(s): VITAMINB12, FOLATE, FERRITIN, TIBC, IRON, RETICCTPCT in the last 72 hours. Sepsis Labs: No results for input(s): PROCALCITON, LATICACIDVEN in the last 168 hours.  Recent Results (from the past 240 hour(s))  SARS Coronavirus 2 by RT PCR (hospital order, performed in [facility] hospital lab) Nasopharyngeal Nasopharyngeal Swab     Status: None   Collection Time: 04/15/20  7:23 PM   Specimen: Nasopharyngeal Swab  Result Value Ref Range Status    SARS Coronavirus 2 NEGATIVE NEGATIVE Final    Comment: (NOTE) SARS-CoV-2 target nucleic acids are NOT DETECTED.  The SARS-CoV-2 RNA is generally detectable in upper and lower respiratory specimens during the acute phase of infection. The lowest concentration of SARS-CoV-2 viral copies this assay can detect is 250 copies / mL. A negative result does not  preclude SARS-CoV-2 infection and should not be used as the sole basis for treatment or other patient management decisions.  A negative result may occur with improper specimen collection / handling, submission of specimen other than nasopharyngeal swab, presence of viral mutation(s) within the areas targeted by this assay, and inadequate number of viral copies (<250 copies / mL). A negative result must be combined with clinical observations, patient history, and epidemiological information.  Fact Sheet for Patients:   StrictlyIdeas.no  Fact Sheet for Healthcare Providers: BankingDealers.co.za  This test is not yet approved or  cleared by the [place] FDA and has been authorized for detection and/or diagnosis of SARS-CoV-2 by FDA under an Emergency Use Authorization (EUA).  This EUA will remain in effect (meaning this test can be used) for the duration of the COVID-19 declaration under Section 564(b)(1) of the Act, 21 U.S.C. section 360bbb-3(b)(1), unless the authorization is terminated or revoked sooner.  Performed at [facility], [address] [place], [place] [address]          Radiology Studies: DG Chest 2 View  Result Date: 04/15/2020 CLINICAL DATA:  Worsening shortness of breath, chest pain EXAM: CHEST - 2 VIEW COMPARISON:  04/13/2020 FINDINGS: Frontal and lateral views of the chest demonstrate stable background scarring and fibrosis. No airspace disease, effusion, or pneumothorax. Cardiac silhouette is stable. IMPRESSION: 1. Stable scarring and fibrosis.   No acute process. Electronically Signed   By: Randa Ngo M.D.   On: 04/15/2020 15:06   CT HEAD WO CONTRAST  Result Date: 04/16/2020 CLINICAL DATA:  Headache EXAM: CT HEAD WITHOUT CONTRAST TECHNIQUE: Contiguous axial images were obtained from the base of the skull through the vertex without intravenous contrast. COMPARISON:  CT head 08/12/2016 FINDINGS: Brain: No evidence of acute infarction, hemorrhage, hydrocephalus, extra-axial collection or mass lesion/mass effect. Mild atrophy consistent with age. Vascular: Negative for hyperdense vessel Skull: Negative Sinuses/Orbits: Paranasal sinuses clear. No orbital lesion. Bilateral cataract extraction. Other: None IMPRESSION: Negative for age.  No acute abnormality Electronically Signed   By: Franchot Gallo M.D.   On: 04/16/2020 14:29   US Venous Img Lower Bilateral (DVT)  Result Date: 04/16/2020 CLINICAL DATA:  74 year old female with bilateral leg pain. History of clotting disorder factor 5 Leiden. EXAM: BILATERAL LOWER EXTREMITY VENOUS DOPPLER ULTRASOUND TECHNIQUE: Gray-scale sonography with graded compression, as well as color Doppler and duplex ultrasound were performed to evaluate the lower extremity deep venous systems from the level of the common femoral vein and including the common femoral, femoral, profunda femoral, popliteal and calf veins including the posterior tibial, peroneal and gastrocnemius veins when visible. The superficial great saphenous vein was also interrogated. Spectral Doppler was utilized to evaluate flow at rest and with distal augmentation maneuvers in the common femoral, femoral and popliteal veins. COMPARISON:  None. FINDINGS: RIGHT LOWER EXTREMITY Common Femoral Vein: No evidence of thrombus. Normal compressibility, respiratory phasicity and response to augmentation. Saphenofemoral Junction: No evidence of thrombus. Normal compressibility and flow on color Doppler imaging. Profunda Femoral Vein: No evidence of thrombus. Normal  compressibility and flow on color Doppler imaging. Femoral Vein: No evidence of thrombus. Normal compressibility, respiratory phasicity and response to augmentation. Popliteal Vein: No evidence of thrombus. Normal compressibility, respiratory phasicity and response to augmentation. Calf Veins: No evidence of thrombus. Normal compressibility and flow on color Doppler imaging. Other Findings:  None. LEFT LOWER EXTREMITY Common Femoral Vein: No evidence of thrombus. Normal compressibility, respiratory phasicity and response to augmentation. Saphenofemoral Junction: No evidence of  thrombus. Normal compressibility and flow on color Doppler imaging. Profunda Femoral Vein: No evidence of thrombus. Normal compressibility and flow on color Doppler imaging. Femoral Vein: No evidence of thrombus. Normal compressibility, respiratory phasicity and response to augmentation. Popliteal Vein: No evidence of thrombus. Normal compressibility, respiratory phasicity and response to augmentation. Calf Veins: No evidence of thrombus. Normal compressibility and flow on color Doppler imaging. Other Findings:  None. IMPRESSION: No evidence of deep venous thrombosis in either lower extremity. Electronically Signed   By: Genevie Ann M.D.   On: 04/16/2020 00:43   ECHOCARDIOGRAM COMPLETE  Result Date: 04/16/2020    ECHOCARDIOGRAM REPORT   Patient Name:   SHAVON ASHMORE Date of Exam: 04/16/2020 Medical Rec #:  [id number]       Height:       60.0 in Accession #:    [id number]      Weight:       128.0 lb Date of Birth:  [birth year]       BSA:          1.544 m Patient Age:    71 years        BP:           135/73 mmHg Patient Gender: F               HR:           80 bpm. Exam Location:  [facility] Procedure: 2D Echo, Cardiac Doppler and Color Doppler Indications:     Dyspnea 786.09  History:         Patient has prior history of Echocardiogram examinations, most                  recent 08/13/2016. Stroke.  Sonographer:     Sherrie Sport RDCS (AE) Referring Phys:   [id number] Arvella Merles MANSY Diagnosing Phys: Isaias Cowman MD IMPRESSIONS  1. Left ventricular ejection fraction, by estimation, is 60 to 65%. The left ventricle has normal function. The left ventricle has no regional wall motion abnormalities. Left ventricular diastolic parameters are consistent with Grade I diastolic dysfunction (impaired relaxation).  2. Right ventricular systolic function is normal. The right ventricular size is normal. There is normal pulmonary artery systolic pressure.  3. The mitral valve is normal in structure. Mild mitral valve regurgitation. No evidence of mitral stenosis.  4. The aortic valve is normal in structure. Aortic valve regurgitation is mild. No aortic stenosis is present.  5. The inferior vena cava is normal in size with greater than 50% respiratory variability, suggesting right atrial pressure of 3 mmHg. FINDINGS  Left Ventricle: Left ventricular ejection fraction, by estimation, is 60 to 65%. The left ventricle has normal function. The left ventricle has no regional wall motion abnormalities. The left ventricular internal cavity size was normal in size. There is  no left ventricular hypertrophy. Left ventricular diastolic parameters are consistent with Grade I diastolic dysfunction (impaired relaxation). Right Ventricle: The right ventricular size is normal. No increase in right ventricular wall thickness. Right ventricular systolic function is normal. There is normal pulmonary artery systolic pressure. The tricuspid regurgitant velocity is 2.51 m/s, and  with an assumed right atrial pressure of 10 mmHg, the estimated right ventricular systolic pressure is 35.4 mmHg. Left Atrium: Left atrial size was normal in size. Right Atrium: Right atrial size was normal in size. Pericardium: There is no evidence of pericardial effusion. Mitral Valve: The mitral valve is normal in structure. Normal mobility of the mitral valve leaflets. Mild mitral  valve regurgitation. No evidence of mitral  valve stenosis. Tricuspid Valve: The tricuspid valve is normal in structure. Tricuspid valve regurgitation is mild . No evidence of tricuspid stenosis. Aortic Valve: The aortic valve is normal in structure. Aortic valve regurgitation is mild. No aortic stenosis is present. Aortic valve mean gradient measures 3.0 mmHg. Aortic valve peak gradient measures 5.2 mmHg. Aortic valve area, by VTI measures 3.39 cm. Pulmonic Valve: The pulmonic valve was normal in structure. Pulmonic valve regurgitation is not visualized. No evidence of pulmonic stenosis. Aorta: The aortic root is normal in size and structure. Venous: The inferior vena cava is normal in size with greater than 50% respiratory variability, suggesting right atrial pressure of 3 mmHg. IAS/Shunts: No atrial level shunt detected by color flow Doppler.  LEFT VENTRICLE PLAX 2D LVIDd:         4.56 cm  Diastology LVIDs:         3.24 cm  LV e' lateral:   8.38 cm/s LV PW:         1.06 cm  LV E/e' lateral: 6.7 LV IVS:        0.64 cm  LV e' medial:    5.87 cm/s LVOT diam:     2.00 cm  LV E/e' medial:  9.6 LV SV:         72 LV SV Index:   46 LVOT Area:     3.14 cm  RIGHT VENTRICLE RV Basal diam:  2.70 cm RV S prime:     18.00 cm/s TAPSE (M-mode): 3.6 cm LEFT ATRIUM             Index       RIGHT ATRIUM           Index LA diam:        4.00 cm 2.59 cm/m  RA Area:     15.60 cm LA Vol (A2C):   44.0 ml 28.50 ml/m RA Volume:   42.10 ml  27.26 ml/m LA Vol (A4C):   39.6 ml 25.65 ml/m LA Biplane Vol: 44.5 ml 28.82 ml/m  AORTIC VALVE                   PULMONIC VALVE AV Area (Vmax):    3.22 cm    PV Vmax:        0.64 m/s AV Area (Vmean):   2.61 cm    PV Peak grad:   1.7 mmHg AV Area (VTI):     3.39 cm    RVOT Peak grad: 2 mmHg AV Vmax:           114.00 cm/s AV Vmean:          83.450 cm/s AV VTI:            0.211 m AV Peak Grad:      5.2 mmHg AV Mean Grad:      3.0 mmHg LVOT Vmax:         117.00 cm/s LVOT Vmean:        69.200 cm/s LVOT VTI:          0.228 m LVOT/AV VTI ratio:  1.08  AORTA Ao Root diam: 3.30 cm MITRAL VALVE               TRICUSPID VALVE MV Area (PHT): 3.79 cm    TR Peak grad:   25.2 mmHg MV Decel Time: 200 msec    TR Vmax:        251.00 cm/s MV E  velocity: 56.10 cm/s MV A velocity: 95.50 cm/s  SHUNTS MV E/A ratio:  0.59        Systemic VTI:  0.23 m                            Systemic Diam: 2.00 cm Isaias Cowman MD Electronically signed by Isaias Cowman MD Signature Date/Time: 04/16/2020/12:59:41 PM    Final         Scheduled Meds: . atorvastatin  20 mg Oral Daily  . azithromycin  500 mg Oral Daily  . cholecalciferol  1,000 Units Oral Daily  . DULoxetine  60 mg Oral Daily  . enoxaparin (LOVENOX) injection  40 mg Subcutaneous Q24H  . guaiFENesin  600 mg Oral BID  . insulin aspart  0-9 Units Subcutaneous TID WC  . ipratropium-albuterol  3 mL Nebulization QID  . pantoprazole  40 mg Oral BID  . [START ON 04/18/2020] predniSONE  40 mg Oral Q breakfast  . sodium chloride flush  3 mL Intravenous Q12H   Continuous Infusions: . sodium chloride Stopped (04/16/20 2015)  . cefTRIAXone (ROCEPHIN)  IV Stopped (04/16/20 2045)     LOS: 1 day    Time spent: 45 minutes    Sharen Hones, MD [facility]   To contact the attending provider between 7A-7P or the covering provider during after hours 7P-7A, please log into the web site [email or web address] and access using universal [place] password for that web site. If you do not have the password, please call the hospital operator.  04/17/2020, 12:38 PM

## 2020-04-17 NOTE — Progress Notes (Signed)
[facility] Cardiology    SUBJECTIVE: The patient reports feeling poorly this morning. She reports that with any ambulation, including going from her bed to the bathroom, she has a bout of coughing, feels short of breath, weak and dizzy, and her heart races. She denies chest pain.    Vitals:   04/16/20 2311 04/17/20 0245 04/17/20 0749 04/17/20 0751  BP: 96/73 131/72  (!) 143/91  Pulse: 97 85  92  Resp: 16 16  18   Temp: 98.7 F (37.1 C) 98.6 F (37 C)  98.2 F (36.8 C)  TempSrc: Oral Oral  Oral  SpO2: 96% 98% 100% 100%  Weight:      Height:         Intake/Output Summary (Last 24 hours) at 04/17/2020 04/19/2020 Last data filed at 04/16/2020 2100 Gross per 24 hour  Intake 100.11 ml  Output --  Net 100.11 ml      PHYSICAL EXAM  General: ill-appearing, lying in fetal position in bed, no acute distress HEENT:  Normocephalic and atramatic Neck:   No JVD.  Lungs: normal effort of breathing at rest on room air, bibasilar rhonchi Heart: regular rhythm, tachycardic. Normal S1 and S2 without gallops or murmurs.  Abdomen: nondistended Msk:  Back normal, gait not assessed. No obvious deformities. Extremities: No clubbing, cyanosis or edema.   Neuro: Alert and oriented X 3. Psych:  Good affect, responds appropriately  LABS: Basic Metabolic Panel: Recent Labs    04/16/20 0428 04/17/20 0359  NA 139 138  K 3.7 3.9  CL 101 101  CO2 25 28  GLUCOSE 149* 139*  BUN 18 27*  CREATININE 1.02* 1.06*  CALCIUM 9.0 8.9  MG  --  1.9   Liver Function Tests: No results for input(s): AST, ALT, ALKPHOS, BILITOT, PROT, ALBUMIN in the last 72 hours. No results for input(s): LIPASE, AMYLASE in the last 72 hours. CBC: Recent Labs    04/16/20 0428 04/17/20 0359  WBC 6.4 9.7  NEUTROABS  --  8.4*  HGB 13.7 13.1  HCT 38.8 38.9  MCV 88.0 91.1  PLT 242 247   Cardiac Enzymes: No results for input(s): CKTOTAL, CKMB, CKMBINDEX, TROPONINI in the last 72 hours. BNP: Invalid input(s):  POCBNP D-Dimer: No results for input(s): DDIMER in the last 72 hours. Hemoglobin A1C: Recent Labs    04/16/20 0428  HGBA1C 5.6   Fasting Lipid Panel: Recent Labs    04/17/20 0359  CHOL 185  HDL 82  LDLCALC 92  TRIG 57  CHOLHDL 2.3   Thyroid Function Tests: No results for input(s): TSH, T4TOTAL, T3FREE, THYROIDAB in the last 72 hours.  Invalid input(s): FREET3 Anemia Panel: No results for input(s): VITAMINB12, FOLATE, FERRITIN, TIBC, IRON, RETICCTPCT in the last 72 hours.  DG Chest 2 View  Result Date: 04/15/2020 CLINICAL DATA:  Worsening shortness of breath, chest pain EXAM: CHEST - 2 VIEW COMPARISON:  04/13/2020 FINDINGS: Frontal and lateral views of the chest demonstrate stable background scarring and fibrosis. No airspace disease, effusion, or pneumothorax. Cardiac silhouette is stable. IMPRESSION: 1. Stable scarring and fibrosis.  No acute process. Electronically Signed   By: 06/14/2020 M.D.   On: 04/15/2020 15:06   CT HEAD WO CONTRAST  Result Date: 04/16/2020 CLINICAL DATA:  Headache EXAM: CT HEAD WITHOUT CONTRAST TECHNIQUE: Contiguous axial images were obtained from the base of the skull through the vertex without intravenous contrast. COMPARISON:  CT head 08/12/2016 FINDINGS: Brain: No evidence of acute infarction, hemorrhage, hydrocephalus, extra-axial collection or mass lesion/mass  effect. Mild atrophy consistent with age. Vascular: Negative for hyperdense vessel Skull: Negative Sinuses/Orbits: Paranasal sinuses clear. No orbital lesion. Bilateral cataract extraction. Other: None IMPRESSION: Negative for age.  No acute abnormality Electronically Signed   By: Marlan Palauharles  Clark M.D.   On: 04/16/2020 14:29   US Venous Img Lower Bilateral (DVT)  Result Date: 04/16/2020 CLINICAL DATA:  74 year old female with bilateral leg pain. History of clotting disorder factor 5 Leiden. EXAM: BILATERAL LOWER EXTREMITY VENOUS DOPPLER ULTRASOUND TECHNIQUE: Gray-scale sonography with graded  compression, as well as color Doppler and duplex ultrasound were performed to evaluate the lower extremity deep venous systems from the level of the common femoral vein and including the common femoral, femoral, profunda femoral, popliteal and calf veins including the posterior tibial, peroneal and gastrocnemius veins when visible. The superficial great saphenous vein was also interrogated. Spectral Doppler was utilized to evaluate flow at rest and with distal augmentation maneuvers in the common femoral, femoral and popliteal veins. COMPARISON:  None. FINDINGS: RIGHT LOWER EXTREMITY Common Femoral Vein: No evidence of thrombus. Normal compressibility, respiratory phasicity and response to augmentation. Saphenofemoral Junction: No evidence of thrombus. Normal compressibility and flow on color Doppler imaging. Profunda Femoral Vein: No evidence of thrombus. Normal compressibility and flow on color Doppler imaging. Femoral Vein: No evidence of thrombus. Normal compressibility, respiratory phasicity and response to augmentation. Popliteal Vein: No evidence of thrombus. Normal compressibility, respiratory phasicity and response to augmentation. Calf Veins: No evidence of thrombus. Normal compressibility and flow on color Doppler imaging. Other Findings:  None. LEFT LOWER EXTREMITY Common Femoral Vein: No evidence of thrombus. Normal compressibility, respiratory phasicity and response to augmentation. Saphenofemoral Junction: No evidence of thrombus. Normal compressibility and flow on color Doppler imaging. Profunda Femoral Vein: No evidence of thrombus. Normal compressibility and flow on color Doppler imaging. Femoral Vein: No evidence of thrombus. Normal compressibility, respiratory phasicity and response to augmentation. Popliteal Vein: No evidence of thrombus. Normal compressibility, respiratory phasicity and response to augmentation. Calf Veins: No evidence of thrombus. Normal compressibility and flow on color  Doppler imaging. Other Findings:  None. IMPRESSION: No evidence of deep venous thrombosis in either lower extremity. Electronically Signed   By: Odessa FlemingH  Hall M.D.   On: 04/16/2020 00:43   ECHOCARDIOGRAM COMPLETE  Result Date: 04/16/2020    ECHOCARDIOGRAM REPORT   Patient Name:   Ashley SheriffROSEMARIE Fettes Date of Exam: 04/16/2020 Medical Rec #:  [id number]       Height:       60.0 in Accession #:    [id number]      Weight:       128.0 lb Date of Birth:  02-Nov-1945       BSA:          1.544 m Patient Age:    74 years        BP:           135/73 mmHg Patient Gender: F               HR:           80 bpm. Exam Location:  [facility] Procedure: 2D Echo, Cardiac Doppler and Color Doppler Indications:     Dyspnea 786.09  History:         Patient has prior history of Echocardiogram examinations, most                  recent 08/13/2016. Stroke.  Sonographer:     Cristela BlueJerry Hege RDCS (AE) Referring Phys:  [phone] [facility]  [facility] MANSY Diagnosing Phys: Marcina Millard MD IMPRESSIONS  1. Left ventricular ejection fraction, by estimation, is 60 to 65%. The left ventricle has normal function. The left ventricle has no regional wall motion abnormalities. Left ventricular diastolic parameters are consistent with Grade I diastolic dysfunction (impaired relaxation).  2. Right ventricular systolic function is normal. The right ventricular size is normal. There is normal pulmonary artery systolic pressure.  3. The mitral valve is normal in structure. Mild mitral valve regurgitation. No evidence of mitral stenosis.  4. The aortic valve is normal in structure. Aortic valve regurgitation is mild. No aortic stenosis is present.  5. The inferior vena cava is normal in size with greater than 50% respiratory variability, suggesting right atrial pressure of 3 mmHg. FINDINGS  Left Ventricle: Left ventricular ejection fraction, by estimation, is 60 to 65%. The left ventricle has normal function. The left ventricle has no regional wall motion abnormalities. The left ventricular  internal cavity size was normal in size. There is  no left ventricular hypertrophy. Left ventricular diastolic parameters are consistent with Grade I diastolic dysfunction (impaired relaxation). Right Ventricle: The right ventricular size is normal. No increase in right ventricular wall thickness. Right ventricular systolic function is normal. There is normal pulmonary artery systolic pressure. The tricuspid regurgitant velocity is 2.51 m/s, and  with an assumed right atrial pressure of 10 mmHg, the estimated right ventricular systolic pressure is 35.2 mmHg. Left Atrium: Left atrial size was normal in size. Right Atrium: Right atrial size was normal in size. Pericardium: There is no evidence of pericardial effusion. Mitral Valve: The mitral valve is normal in structure. Normal mobility of the mitral valve leaflets. Mild mitral valve regurgitation. No evidence of mitral valve stenosis. Tricuspid Valve: The tricuspid valve is normal in structure. Tricuspid valve regurgitation is mild . No evidence of tricuspid stenosis. Aortic Valve: The aortic valve is normal in structure. Aortic valve regurgitation is mild. No aortic stenosis is present. Aortic valve mean gradient measures 3.0 mmHg. Aortic valve peak gradient measures 5.2 mmHg. Aortic valve area, by VTI measures 3.39 cm. Pulmonic Valve: The pulmonic valve was normal in structure. Pulmonic valve regurgitation is not visualized. No evidence of pulmonic stenosis. Aorta: The aortic root is normal in size and structure. Venous: The inferior vena cava is normal in size with greater than 50% respiratory variability, suggesting right atrial pressure of 3 mmHg. IAS/Shunts: No atrial level shunt detected by color flow Doppler.  LEFT VENTRICLE PLAX 2D LVIDd:         4.56 cm  Diastology LVIDs:         3.24 cm  LV e' lateral:   8.38 cm/s LV PW:         1.06 cm  LV E/e' lateral: 6.7 LV IVS:        0.64 cm  LV e' medial:    5.87 cm/s LVOT diam:     2.00 cm  LV E/e' medial:  9.6  LV SV:         72 LV SV Index:   46 LVOT Area:     3.14 cm  RIGHT VENTRICLE RV Basal diam:  2.70 cm RV S prime:     18.00 cm/s TAPSE (M-mode): 3.6 cm LEFT ATRIUM             Index       RIGHT ATRIUM           Index LA diam:        4.00 cm  2.59 cm/m  RA Area:     15.60 cm LA Vol (A2C):   44.0 ml 28.50 ml/m RA Volume:   42.10 ml  27.26 ml/m LA Vol (A4C):   39.6 ml 25.65 ml/m LA Biplane Vol: 44.5 ml 28.82 ml/m  AORTIC VALVE                   PULMONIC VALVE AV Area (Vmax):    3.22 cm    PV Vmax:        0.64 m/s AV Area (Vmean):   2.61 cm    PV Peak grad:   1.7 mmHg AV Area (VTI):     3.39 cm    RVOT Peak grad: 2 mmHg AV Vmax:           114.00 cm/s AV Vmean:          83.450 cm/s AV VTI:            0.211 m AV Peak Grad:      5.2 mmHg AV Mean Grad:      3.0 mmHg LVOT Vmax:         117.00 cm/s LVOT Vmean:        69.200 cm/s LVOT VTI:          0.228 m LVOT/AV VTI ratio: 1.08  AORTA Ao Root diam: 3.30 cm MITRAL VALVE               TRICUSPID VALVE MV Area (PHT): 3.79 cm    TR Peak grad:   25.2 mmHg MV Decel Time: 200 msec    TR Vmax:        251.00 cm/s MV E velocity: 56.10 cm/s MV A velocity: 95.50 cm/s  SHUNTS MV E/A ratio:  0.59        Systemic VTI:  0.23 m                            Systemic Diam: 2.00 cm Marcina Millard MD Electronically signed by Marcina Millard MD Signature Date/Time: 04/16/2020/12:59:41 PM    Final      Echo normal left ventricular function with LVEF 60 to 65%, no regional wall motion abnormalities, normal right ventricular systolic function, normal pulmonary artery systolic pressure, mild mitral and aortic insufficiency.  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 Acute exertional dyspnea and cough, with chest CT negative for PE, with findings suggestive of pulmonary fibrosis. Started on antibiotic, duo nebs, Solu-Medrol. Appears euvolemic. Pulmonary consult placed.  2D  echocardiogram reveals normal left ventricular function, with no wall motion abnormalities, mild valvular insufficiencies, and normal pulmonary artery systolic pressure. 2. Elevated BNP of 160, appears euvolemic. Denies chest pain, peripheral edema, or orthopnea. Oxygen saturation 100% on room air at rest.    Normal left ventricular function per echo. 3. Tachycardia with ambulation, likely secondary to bout of coughing and underlying pulmonary disease.  Plan: 1. Agree with current plan 2.  Defer further cardiac diagnostics at this time 3.  Await pulmonary recommendations 4.  Defer starting AV nodal blocking agent for treatment of tachycardia at this time and rather treat underlying lung process. 5. Sign off for now; please call/haiku with any questions.   Leanora Ivanoff, PA-C 04/17/2020 8:26 AM  Discussed with Dr. Darrold Junker who agrees with the above plan.

## 2020-04-18 DIAGNOSIS — F329 Major depressive disorder, single episode, unspecified: Secondary | ICD-10-CM

## 2020-04-18 LAB — GLUCOSE, CAPILLARY: Glucose-Capillary: 88 mg/dL (ref 70–99)

## 2020-04-18 MED ORDER — AZITHROMYCIN 500 MG PO TABS: 500.0000 mg | ORAL_TABLET | Freq: Every day | ORAL | 0 refills | Status: AC

## 2020-04-18 MED ORDER — PREDNISONE 20 MG PO TABS: ORAL_TABLET | ORAL | 0 refills | Status: AC

## 2020-04-18 MED ORDER — ALBUTEROL SULFATE HFA 108 (90 BASE) MCG/ACT IN AERS
1.0000 | INHALATION_SPRAY | RESPIRATORY_TRACT | 0 refills | Status: AC | PRN
Start: 2020-04-18 — End: ?

## 2020-04-18 MED ORDER — IPRATROPIUM-ALBUTEROL 0.5-2.5 (3) MG/3ML IN SOLN: 3.0000 mL | Freq: Three times a day (TID) | RESPIRATORY_TRACT | Status: DC

## 2020-04-18 MED ORDER — DULOXETINE HCL 60 MG PO CPEP: 60.0000 mg | ORAL_CAPSULE | Freq: Every day | ORAL | 0 refills | Status: AC

## 2020-04-18 NOTE — Progress Notes (Addendum)
Patient is being discharged home this morning. Son is bedside to transport home. Patient gathered belongings. DC instructions given and patient acknowledged understanding. IV removed.

## 2020-04-18 NOTE — Discharge Summary (Signed)
Physician Discharge Summary  Patient ID: Ashley Murillo MRN: [id number] DOB/AGE: 11-30-1945 74 y.o.  Admit date: 04/15/2020 Discharge date: 04/18/2020  Admission Diagnoses:  Discharge Diagnoses:  Active Problems:   Dyspnea   Acute exacerbation of idiopathic pulmonary fibrosis (HCC) Depression and Murillo.  Discharged Condition: good  Hospital Course:  Ashley Murillo, Ashley Murillo, Ashley Murillo, Ashley Murillo, Ashley Murillo, who presented to the emergency room with acute onset worsening dyspnea since Monday with associated dry cough without significant wheezing. She had a CT scan 2 days ago showed interstitial pulmonary fibrosis, no PE. She is a placed on IV steroids and antibiotics for exacerbationofpulmonary fibrosis. Cardiology and pulmonology consult is obtained.  7/16.  Patient still complaining intermittent shortness of breath.  Echocardiogram showed ejection fraction 60 to 65%, no pulmonary hypertension.  Reviewed the CT angiogram performed before admission again, interstitial changes are very mild.  Patient also has saturation of 100% on room air now.    She may have interstitial lung disease, she also has significant Murillo. Steroids decreased to 40 mg of prednisone daily.  Discontinue Rocephin.  Ativan added.  7/18.  Patient had a bedside pulmonary function test,FEV1/FVC was essentially normal.  At this point, she is medically stable to be discharged.  She will follow up with pulmonology as well as her primary care physician in the near future.  She also had depression and Murillo, I represcribed her Cymbalta per family request.    Consults: pulmonary/intensive care  Significant Diagnostic Studies:  CT ANGIOGRAPHY CHEST WITH CONTRAST  TECHNIQUE: Multidetector CT imaging of the chest was performed using the standard protocol during bolus administration of intravenous contrast. Multiplanar  CT image reconstructions and MIPs were obtained to evaluate the vascular anatomy.  CONTRAST:  56mL OMNIPAQUE IOHEXOL 350 MG/ML SOLN  COMPARISON:  01/05/2009  FINDINGS: Cardiovascular: Satisfactory opacification of the pulmonary arteries to the segmental level. No evidence of pulmonary embolism. Normal heart size. Left coronary artery calcifications. No pericardial effusion. Scattered aortic atherosclerosis.  Mediastinum/Nodes: No enlarged mediastinal, hilar, or axillary lymph nodes. Thyroid gland, trachea, and esophagus demonstrate no significant findings.  Lungs/Pleura: There is mild pulmonary fibrosis in a pattern with no clear apical to basal gradient featuring predominantly subpleural peripheral interstitial opacity and some very fine nodular quality, particularly at the lung apices (series 6, image 20, 67). These findings appear to be slightly worsened in comparison to remote prior CT dated 01/05/2009. No pleural effusion or pneumothorax.  Upper Abdomen: No acute abnormality.  Musculoskeletal: No chest wall abnormality. Age indeterminate wedge deformity of the T8 vertebral body.  Review of the MIP images confirms the above findings.  IMPRESSION: 1. Negative examination for pulmonary embolism. 2. Mild pulmonary fibrosis in a pattern with no clear apical to basal gradient featuring predominantly subpleural peripheral interstitial opacity and some very fine nodular quality. Findings are in keeping with an "alternative diagnosis" pattern of fibrosis by ATS pulmonary fibrosis criteria, general differential considerations including hypersensitivity pneumonitis and sarcoidosis. Consider pulmonary referral and ILD protocol CT follow-up to assess for stability. 3. Age indeterminate wedge deformity of the T8 vertebral body. 4. Coronary artery disease.  Aortic Atherosclerosis (ICD10-I70.0).   Electronically Signed   By: Lauralyn Primes M.D.   On: 04/13/2020  13:08  BILATERAL LOWER EXTREMITY VENOUS DOPPLER ULTRASOUND  TECHNIQUE: Gray-scale sonography with graded compression, as well as color Doppler and duplex ultrasound were performed to evaluate the lower extremity deep venous systems from the level of the common  femoral vein and including the common femoral, femoral, profunda femoral, popliteal and calf veins including the posterior tibial, peroneal and gastrocnemius veins when visible. The superficial great saphenous vein was also interrogated. Spectral Doppler was utilized to evaluate flow at rest and with distal augmentation maneuvers in the common femoral, femoral and popliteal veins.  COMPARISON:  None.  FINDINGS: RIGHT LOWER EXTREMITY  Common Femoral Vein: No evidence of thrombus. Normal compressibility, respiratory phasicity and response to augmentation.  Saphenofemoral Junction: No evidence of thrombus. Normal compressibility and flow on color Doppler imaging.  Profunda Femoral Vein: No evidence of thrombus. Normal compressibility and flow on color Doppler imaging.  Femoral Vein: No evidence of thrombus. Normal compressibility, respiratory phasicity and response to augmentation.  Popliteal Vein: No evidence of thrombus. Normal compressibility, respiratory phasicity and response to augmentation.  Calf Veins: No evidence of thrombus. Normal compressibility and flow on color Doppler imaging.  Other Findings:  None.  LEFT LOWER EXTREMITY  Common Femoral Vein: No evidence of thrombus. Normal compressibility, respiratory phasicity and response to augmentation.  Saphenofemoral Junction: No evidence of thrombus. Normal compressibility and flow on color Doppler imaging.  Profunda Femoral Vein: No evidence of thrombus. Normal compressibility and flow on color Doppler imaging.  Femoral Vein: No evidence of thrombus. Normal compressibility, respiratory phasicity and response to augmentation.  Popliteal  Vein: No evidence of thrombus. Normal compressibility, respiratory phasicity and response to augmentation.  Calf Veins: No evidence of thrombus. Normal compressibility and flow on color Doppler imaging.  Other Findings:  None.  IMPRESSION: No evidence of deep venous thrombosis in either lower extremity.   Electronically Signed   By: Odessa Fleming M.D.   On: 04/16/2020 00:43   Treatments: Steroids, zithromax  Discharge Exam: Blood pressure 138/66, pulse 87, temperature (!) 97.5 F (36.4 C), resp. rate 18, height 5' (1.524 m), weight 58.1 kg, SpO2 100 %. General appearance: alert and cooperative Resp: clear to auscultation bilaterally Cardio: regular rate and rhythm, S1, S2 normal, no murmur, click, rub or gallop GI: soft, non-tender; bowel sounds normal; no masses,  no organomegaly Extremities: extremities normal, atraumatic, no cyanosis or edema  Disposition: Discharge disposition: 01-Home or Self Care       Discharge Instructions    Diet - low sodium heart healthy   Complete by: As directed    Increase activity slowly   Complete by: As directed      Allergies as of 04/18/2020      Reactions   Sulfa Antibiotics Anaphylaxis   Tape Other (See Comments)   Latex Itching, Rash      Medication List    STOP taking these medications   latanoprost 0.005 % ophthalmic solution Commonly known as: XALATAN     TAKE these medications   albuterol 108 (90 Base) MCG/ACT inhaler Commonly known as: VENTOLIN HFA Inhale 1 puff into the lungs every 4 (four) hours as needed for wheezing or shortness of breath.   atorvastatin 20 MG tablet Commonly known as: LIPITOR Take 20 mg by mouth daily.   azithromycin 500 MG tablet Commonly known as: ZITHROMAX Take 1 tablet (500 mg total) by mouth daily for 1 day.   diphenhydrAMINE 25 mg capsule Commonly known as: BENADRYL Take 25 mg by mouth at bedtime as needed for allergies.   DULoxetine 60 MG capsule Commonly known as:  CYMBALTA Take 1 capsule (60 mg total) by mouth daily.   pantoprazole 40 MG tablet Commonly known as: PROTONIX Take 1 tablet (40 mg total) by mouth daily.  What changed: when to take this   predniSONE 20 MG tablet Commonly known as: DELTASONE Take 2 tablets (40 mg total) by mouth daily with breakfast for 2 days, THEN 1 tablet (20 mg total) daily with breakfast for 3 days, THEN 0.5 tablets (10 mg total) daily with breakfast for 3 days. Start taking on: April 19, 2020 What changed:   medication strength  See the new instructions.   traMADol 50 MG tablet Commonly known as: ULTRAM Take 1 tablet (50 mg total) by mouth 2 (two) times daily as needed.   traZODone 100 MG tablet Commonly known as: DESYREL Take 100 mg by mouth at bedtime as needed for sleep.   Vitamin D-1000 Max St 25 MCG (1000 UT) tablet Generic drug: Cholecalciferol Take 1,000 mg by mouth daily.       Follow-up Information    Kandyce Rud, MD Follow up in 1 week(s).   Specialty: Family Medicine Contact information: [address] [facility] [place] [address] [phone]        Vida Rigger, MD Follow up.   Specialty: Pulmonary Disease Why: as scheduled. Contact information: [address] [place] [address] [phone]               Signed: Marrion Coy 04/18/2020, 10:39 AM

## 2020-04-18 NOTE — Plan of Care (Signed)

## 2020-04-22 ENCOUNTER — Other Ambulatory Visit: Payer: Self-pay | Admitting: Pulmonary Disease

## 2020-04-22 DIAGNOSIS — J8410 Pulmonary fibrosis, unspecified: Secondary | ICD-10-CM

## 2021-02-19 ENCOUNTER — Other Ambulatory Visit: Payer: Self-pay

## 2021-02-19 ENCOUNTER — Emergency Department
Admission: EM | Admit: 2021-02-19 | Discharge: 2021-02-19 | Disposition: A | Discharge status: Home / Self Care | Payer: Medicare Other | Attending: Emergency Medicine | Admitting: Emergency Medicine

## 2021-02-19 ENCOUNTER — Emergency Department: Payer: Medicare Other

## 2021-02-19 ENCOUNTER — Encounter: Payer: Self-pay | Admitting: Medical Oncology

## 2021-02-19 DIAGNOSIS — Z87891 Personal history of nicotine dependence: Secondary | ICD-10-CM | POA: Insufficient documentation

## 2021-02-19 DIAGNOSIS — Z91040 Latex allergy status: Secondary | ICD-10-CM | POA: Insufficient documentation

## 2021-02-19 DIAGNOSIS — Z8673 Personal history of transient ischemic attack (TIA), and cerebral infarction without residual deficits: Secondary | ICD-10-CM | POA: Diagnosis not present

## 2021-02-19 DIAGNOSIS — R0602 Shortness of breath: Secondary | ICD-10-CM | POA: Diagnosis present

## 2021-02-19 DIAGNOSIS — J8410 Pulmonary fibrosis, unspecified: Secondary | ICD-10-CM | POA: Diagnosis not present

## 2021-02-19 LAB — CBC
HCT: 41.4 % (ref 36.0–46.0)
Hemoglobin: 13.8 g/dL (ref 12.0–15.0)
MCH: 30.9 pg (ref 26.0–34.0)
MCHC: 33.3 g/dL (ref 30.0–36.0)
MCV: 92.6 fL (ref 80.0–100.0)
Platelets: 263 10*3/uL (ref 150–400)
RBC: 4.47 MIL/uL (ref 3.87–5.11)
RDW: 14.2 % (ref 11.5–15.5)
WBC: 10.8 10*3/uL — ABNORMAL HIGH (ref 4.0–10.5)
nRBC: 0 % (ref 0.0–0.2)

## 2021-02-19 LAB — BASIC METABOLIC PANEL
Anion gap: 10 (ref 5–15)
BUN: 14 mg/dL (ref 8–23)
CO2: 27 mmol/L (ref 22–32)
Calcium: 10.2 mg/dL (ref 8.9–10.3)
Chloride: 102 mmol/L (ref 98–111)
Creatinine, Ser: 0.82 mg/dL (ref 0.44–1.00)
GFR, Estimated: >60 mL/min (ref >60)
Glucose, Bld: 125 mg/dL — ABNORMAL HIGH (ref 70–99)
Potassium: 3.7 mmol/L (ref 3.5–5.1)
Sodium: 139 mmol/L (ref 135–145)

## 2021-02-19 LAB — TROPONIN I (HIGH SENSITIVITY)
Troponin I (High Sensitivity): 4 ng/L (ref <18)
Troponin I (High Sensitivity): 5 ng/L (ref <18)

## 2021-02-19 MED ORDER — METHYLPREDNISOLONE 4 MG PO TABS: ORAL_TABLET | ORAL | 0 refills | Status: AC

## 2021-02-19 MED ORDER — METHYLPREDNISOLONE SODIUM SUCC 125 MG IJ SOLR
125.0000 mg | Freq: Once | INTRAMUSCULAR | Status: AC
  Administered 2021-02-19: 125 mg via INTRAVENOUS
  Filled 2021-02-19: qty 2

## 2021-02-19 MED ORDER — IPRATROPIUM-ALBUTEROL 0.5-2.5 (3) MG/3ML IN SOLN
3.0000 mL | Freq: Once | RESPIRATORY_TRACT | Status: AC
  Administered 2021-02-19: 3 mL via RESPIRATORY_TRACT
  Filled 2021-02-19: qty 3

## 2021-02-19 NOTE — ED Triage Notes (Signed)
Pt reports that she was seen earlier by her pulmonologist for her fibrosis, pt reports that she was on her way home when she began having left sided chest pressure and more sob than usual.

## 2021-02-19 NOTE — ED Provider Notes (Signed)
[facility] Emergency Department Provider Note   ____________________________________________    I have reviewed the triage vital signs and the nursing notes.   HISTORY  Chief Complaint Shortness of Breath and Chest Pain     HPI Ashley Murillo is a 75 y.o. female with a history of pulmonary fibrosis who presents with complaints of shortness of breath and chest discomfort.  Patient went to see her pulmonologist today Dr. Richardson Dopp, afterward she was walking back to her car and became out of breath and had chest discomfort.  The temperature today is 97 degrees and humid.  She states that she is improved now.  She reports that her pulmonologist had discussed with her starting antifibrotic's.  She denies fevers or chills or cough.  Past Medical History:  Diagnosis Date  . Allergy   . Anxiety   . Arthritis   . Back muscle spasm   . Chronic back pain   . Diverticulosis   . GERD (gastroesophageal reflux disease)   . IBS (irritable bowel syndrome)   . Insomnia   . Low blood monocyte count   . Osteoporosis   . Stroke [facility]) 08/2016    Patient Active Problem List   Diagnosis Date Noted  . Acute exacerbation of idiopathic pulmonary fibrosis (HCC) 04/16/2020  . Dyspnea 04/15/2020  . TIA (transient ischemic attack) 08/12/2016  . Back muscle spasm 02/23/2015  . Thoracic spondylosis with radiculopathy 02/23/2015  . 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 Anxiety state 04/21/2014  . Chronic pain 04/21/2014  . Acid reflux 04/21/2014  . Cannot sleep 04/21/2014  . OP (osteoporosis) 04/21/2014    Past Surgical History:  Procedure Laterality Date  . APPENDECTOMY    . CHOLECYSTECTOMY    . knee cast     left 2015  . uterine tumors      Prior to Admission medications   Medication Sig Start Date End Date Taking? Authorizing Provider  methylPREDNISolone (MEDROL) 4 MG tablet Take 4 tablets (16 mg total) by mouth daily for 2 days, THEN 3 tablets (12  mg total) daily for 2 days, THEN 2 tablets (8 mg total) daily for 2 days, THEN 1 tablet (4 mg total) daily for 3 days. 02/19/21 02/28/21 Yes Jene Every, MD  albuterol (VENTOLIN HFA) 108 (90 Base) MCG/ACT inhaler Inhale 1 puff into the lungs every 4 (four) hours as needed for wheezing or shortness of breath. 04/18/20   Marrion Coy, MD  atorvastatin (LIPITOR) 20 MG tablet Take 20 mg by mouth daily.    [provider]  Cholecalciferol (VITAMIN D-1000 MAX ST) 1000 units tablet Take 1,000 mg by mouth daily.    [provider]  diphenhydrAMINE (BENADRYL) 25 mg capsule Take 25 mg by mouth at bedtime as needed for allergies.     [provider]  DULoxetine (CYMBALTA) 60 MG capsule Take 1 capsule (60 mg total) by mouth daily. 04/18/20   Marrion Coy, MD  pantoprazole (PROTONIX) 40 MG tablet Take 1 tablet (40 mg total) by mouth daily. Patient taking differently: Take 40 mg by mouth 2 (two) times daily.  12/20/14   Gerhard Munch, MD  traMADol (ULTRAM) 50 MG tablet Take 1 tablet (50 mg total) by mouth 2 (two) times daily as needed. 05/07/18   Yevette Edwards, MD  traZODone (DESYREL) 100 MG tablet Take 100 mg by mouth at bedtime as needed for sleep.  12/01/14   [provider]     Allergies Sulfa antibiotics, Tape, and Latex  Family  History  Problem Relation Age of Onset  . GI problems Mother   . Cancer Father     Social History Social History   Tobacco Use  . Smoking status: Former Games developer  . Smokeless tobacco: Never Used  Substance Use Topics  . Alcohol use: No  . Drug use: No    Review of Systems  Constitutional: No fever/chills Eyes: No visual changes.  ENT: No sore throat. Cardiovascular: As above Respiratory: As above Gastrointestinal: No abdominal pain.  No nausea, no vomiting.   Genitourinary: Negative for dysuria. Musculoskeletal: Negative for back pain. Skin: Negative for rash. Neurological: Negative for headaches or  weakness   ____________________________________________   PHYSICAL EXAM:  VITAL SIGNS: ED Triage Vitals  Enc Vitals Group     BP 02/19/21 1207 (!) 143/93     Pulse Rate 02/19/21 1207 82     Resp 02/19/21 1207 (!) 24     Temp 02/19/21 1207 97.8 F (36.6 C)     Temp Source 02/19/21 1207 Oral     SpO2 02/19/21 1207 99 %     Weight 02/19/21 1208 70.3 kg (155 lb)     Height 02/19/21 1208 1.524 m (5')     Head Circumference --      Peak Flow --      Pain Score 02/19/21 1207 9     Pain Loc --      Pain Edu? --      Excl. in GC? --     Constitutional: Alert and oriented. No acute distress. Pleasant and interactive  Nose: No congestion/rhinnorhea. Mouth/Throat: Mucous membranes are moist.   Neck:  Painless ROM Cardiovascular: Normal rate, regular rhythm.  Good peripheral circulation. Respiratory: Increased respiratory effort with mild tachypnea no retractions.  Gastrointestinal: Soft and nontender. No distention.  No CVA tenderness.  Musculoskeletal: No lower extremity tenderness nor edema.  Warm and well perfused Neurologic:  Normal speech and language. No gross focal neurologic deficits are appreciated.  Skin:  Skin is warm, dry and intact. No rash noted. Psychiatric: Mood and affect are normal. Speech and behavior are normal.  ____________________________________________   LABS (all labs ordered are listed, but only abnormal results are displayed)  Labs Reviewed  BASIC METABOLIC PANEL - Abnormal; Notable for the following components:      Result Value   Glucose, Bld 125 (*)    All other components within normal limits  CBC - Abnormal; Notable for the following components:   WBC 10.8 (*)    All other components within normal limits  TROPONIN I (HIGH SENSITIVITY)  TROPONIN I (HIGH SENSITIVITY)   ____________________________________________  EKG   ____________________________________________  RADIOLOGY  CT chest demonstrates worsening interstitial lung  disease, reviewed by me ____________________________________________   PROCEDURES  Procedure(s) performed: No  Procedures   Critical Care performed: No ____________________________________________   INITIAL IMPRESSION / ASSESSMENT AND PLAN / ED COURSE  Pertinent labs & imaging results that were available during my care of the patient were reviewed by me and considered in my medical decision making (see chart for details).  Patient with pulmonary fibrosis, chronically short of breath worse with exertion.  Seems that walking in the very hot temperature high humidity made her short of breath.  She is feeling better now and at her baseline.  Treated with duo nebs as well as Solu-Medrol.  CT chest demonstrates gradually worsening pulmonary fibrosis  Discussed with Dr. Elita Quick recommends Medrol Hinda Kehr, outpatient follow-up, patient agrees with this plan   ____________________________________________  FINAL CLINICAL IMPRESSION(S) / ED DIAGNOSES  Final diagnoses:  Pulmonary fibrosis (HCC)  Shortness of breath        Note:  This document was prepared using Dragon voice recognition software and may include unintentional dictation errors.   Jene Every, MD 02/19/21 [phone]

## 2021-03-16 ENCOUNTER — Other Ambulatory Visit
Admission: RE | Admit: 2021-03-16 | Discharge: 2021-03-16 | Disposition: A | Discharge status: Home / Self Care | Payer: Medicare Other | Source: Ambulatory Visit | Attending: Pulmonary Disease | Admitting: Pulmonary Disease

## 2021-03-16 DIAGNOSIS — J8410 Pulmonary fibrosis, unspecified: Secondary | ICD-10-CM | POA: Diagnosis present

## 2021-03-16 DIAGNOSIS — R0602 Shortness of breath: Secondary | ICD-10-CM | POA: Insufficient documentation

## 2021-03-16 LAB — D-DIMER, QUANTITATIVE: D-Dimer, Quant: <0.27 ug/mL-FEU (ref 0.00–0.50)

## 2023-10-06 ENCOUNTER — Emergency Department: Payer: Medicare Other

## 2023-10-06 ENCOUNTER — Other Ambulatory Visit: Payer: Self-pay

## 2023-10-06 ENCOUNTER — Observation Stay
Admission: EM | Admit: 2023-10-06 | Discharge: 2023-10-07 | Disposition: A | Discharge status: Home / Self Care | Payer: Medicare Other | Attending: Internal Medicine | Admitting: Internal Medicine

## 2023-10-06 DIAGNOSIS — Z87891 Personal history of nicotine dependence: Secondary | ICD-10-CM | POA: Insufficient documentation

## 2023-10-06 DIAGNOSIS — K219 Gastro-esophageal reflux disease without esophagitis: Secondary | ICD-10-CM | POA: Insufficient documentation

## 2023-10-06 DIAGNOSIS — Z1152 Encounter for screening for COVID-19: Secondary | ICD-10-CM | POA: Insufficient documentation

## 2023-10-06 DIAGNOSIS — K529 Noninfective gastroenteritis and colitis, unspecified: Secondary | ICD-10-CM | POA: Diagnosis not present

## 2023-10-06 DIAGNOSIS — R112 Nausea with vomiting, unspecified: Principal | ICD-10-CM | POA: Insufficient documentation

## 2023-10-06 DIAGNOSIS — Z91040 Latex allergy status: Secondary | ICD-10-CM | POA: Diagnosis not present

## 2023-10-06 DIAGNOSIS — Z8673 Personal history of transient ischemic attack (TIA), and cerebral infarction without residual deficits: Secondary | ICD-10-CM | POA: Insufficient documentation

## 2023-10-06 DIAGNOSIS — G8929 Other chronic pain: Secondary | ICD-10-CM | POA: Diagnosis not present

## 2023-10-06 DIAGNOSIS — R42 Dizziness and giddiness: Secondary | ICD-10-CM | POA: Diagnosis present

## 2023-10-06 DIAGNOSIS — J84112 Idiopathic pulmonary fibrosis: Secondary | ICD-10-CM | POA: Insufficient documentation

## 2023-10-06 DIAGNOSIS — F411 Generalized anxiety disorder: Secondary | ICD-10-CM | POA: Diagnosis not present

## 2023-10-06 DIAGNOSIS — R531 Weakness: Secondary | ICD-10-CM | POA: Diagnosis not present

## 2023-10-06 LAB — CBC
HCT: 42.9 % (ref 36.0–46.0)
Hemoglobin: 14 g/dL (ref 12.0–15.0)
MCH: 30.5 pg (ref 26.0–34.0)
MCHC: 32.6 g/dL (ref 30.0–36.0)
MCV: 93.5 fL (ref 80.0–100.0)
Platelets: 287 10*3/uL (ref 150–400)
RBC: 4.59 MIL/uL (ref 3.87–5.11)
RDW: 13.3 % (ref 11.5–15.5)
WBC: 5.5 10*3/uL (ref 4.0–10.5)
nRBC: 0 % (ref 0.0–0.2)

## 2023-10-06 LAB — URINALYSIS, ROUTINE W REFLEX MICROSCOPIC
Bacteria, UA: NONE SEEN
Bilirubin Urine: NEGATIVE
Glucose, UA: NEGATIVE mg/dL
Ketones, ur: 5 mg/dL — AB
Nitrite: NEGATIVE
Protein, ur: NEGATIVE mg/dL
Specific Gravity, Urine: 1.023 (ref 1.005–1.030)
pH: 6 (ref 5.0–8.0)

## 2023-10-06 LAB — COMPREHENSIVE METABOLIC PANEL
ALT: 11 U/L (ref 0–44)
AST: 18 U/L (ref 15–41)
Albumin: 4.1 g/dL (ref 3.5–5.0)
Alkaline Phosphatase: 44 U/L (ref 38–126)
Anion gap: 14 (ref 5–15)
BUN: 10 mg/dL (ref 8–23)
CO2: 27 mmol/L (ref 22–32)
Calcium: 9.2 mg/dL (ref 8.9–10.3)
Chloride: 98 mmol/L (ref 98–111)
Creatinine, Ser: 0.9 mg/dL (ref 0.44–1.00)
GFR, Estimated: >60 mL/min (ref >60)
Glucose, Bld: 117 mg/dL — ABNORMAL HIGH (ref 70–99)
Potassium: 3.6 mmol/L (ref 3.5–5.1)
Sodium: 139 mmol/L (ref 135–145)
Total Bilirubin: 0.9 mg/dL (ref 0.0–1.2)
Total Protein: 7 g/dL (ref 6.5–8.1)

## 2023-10-06 LAB — LIPASE, BLOOD: Lipase: 31 U/L (ref 11–51)

## 2023-10-06 LAB — LACTIC ACID, PLASMA: Lactic Acid, Venous: 1.2 mmol/L (ref 0.5–1.9)

## 2023-10-06 MED ORDER — SODIUM CHLORIDE 0.9 % IV BOLUS
1000.0000 mL | Freq: Once | INTRAVENOUS | Status: AC
  Administered 2023-10-06: 1000 mL via INTRAVENOUS

## 2023-10-06 MED ORDER — ONDANSETRON HCL 4 MG/2ML IJ SOLN
4.0000 mg | Freq: Once | INTRAMUSCULAR | Status: AC
  Administered 2023-10-06: 4 mg via INTRAVENOUS
  Filled 2023-10-06: qty 2

## 2023-10-06 MED ORDER — IOHEXOL 300 MG/ML  SOLN
100.0000 mL | Freq: Once | INTRAMUSCULAR | Status: AC | PRN
  Administered 2023-10-06: 100 mL via INTRAVENOUS

## 2023-10-06 MED ORDER — LACTATED RINGERS IV BOLUS
1000.0000 mL | Freq: Once | INTRAVENOUS | Status: AC
  Administered 2023-10-06: 1000 mL via INTRAVENOUS

## 2023-10-06 NOTE — ED Provider Notes (Signed)
 11:45 PM  Assumed care at shift change.  Patient here for nausea, vomiting and diarrhea with weakness.  Labs unremarkable.  Urine pending but shows no sign of infection.  Additional labs, COVID and flu swab pending.  Chest x-ray clear.  CT of the abdomen pelvis unremarkable.  Will attempt p.o. challenge and ambulatory trial.  1:17 AM  Pt's lactic negative.  Troponin negative.  COVID, flu and RSV negative.  Patient still feeling very nauseated, unable to tolerate p.o., unable to ambulate due to generalized weakness.  Will admit for symptom management likely from viral gastroenteritis and continued IV hydration.   Consulted and discussed patient's case with hospitalist, Dr. Laveda.  I have recommended admission and consulting physician agrees and will place admission orders.  Patient (and family if present) agree with this plan.   I reviewed all nursing notes, vitals, pertinent previous records.  All labs, EKGs, imaging ordered have been independently reviewed and interpreted by myself.    Chavy Avera, Josette SAILOR, DO 10/07/23 [phone]

## 2023-10-06 NOTE — ED Triage Notes (Signed)
 Patient states nausea and vomiting for 2 days; denies abdominal pain.

## 2023-10-06 NOTE — ED Provider Triage Note (Signed)
 Emergency Medicine Provider Triage Evaluation Note  Ashley Murillo , a 78 y.o. female  was evaluated in triage.  Pt complains of 2 days of nauseas, vomit, associated with right lumbar pain.  Patient denies diarrhea, sick contacts, urinary symptoms  Review of Systems  Positive:  Negative:   Physical Exam  BP (!) 157/97   Pulse 93   Temp 97.6 F (36.4 C) (Oral)   Resp 18   Ht 5' (1.524 m)   Wt 61.2 kg   SpO2 96%   BMI 26.37 kg/m  Gen:   Awake, no distress, dehydrated Resp:  Normal effort  MSK:   Moves extremities without difficulty  Other:    Medical Decision Making  Medically screening exam initiated at 4:51 PM.  Appropriate orders placed.  Ashley Murillo was informed that the remainder of the evaluation will be completed by another provider, this initial triage assessment does not replace that evaluation, and the importance of remaining in the ED until their evaluation is complete.     Janit Kast, PA-C 10/06/23 [phone]

## 2023-10-06 NOTE — ED Provider Notes (Signed)
 [facility] Provider Note    Event Date/Time   First MD Initiated Contact with Patient 10/06/23 2136     (approximate)   History   Emesis   HPI  Ashley Murillo is a 78 y.o. female here with generalized weakness.  The patient states that over the last 2 days, she has had nausea, vomiting, and very poor appetite.  She has had progressive worsening weakness.  She has a weak to the point where she can barely walk today.  Reports that every time she get up, she felt very weak and was able to do so.  She lives alone and is normally very independent.  No pain.  No known sick contacts.  No suspicious food intake.  No recent travel.      Physical Exam   Triage Vital Signs: ED Triage Vitals  Encounter Vitals Group     BP 10/06/23 1646 (!) 157/97     Systolic BP Percentile --      Diastolic BP Percentile --      Pulse Rate 10/06/23 1646 93     Resp 10/06/23 1646 18     Temp 10/06/23 1646 97.6 F (36.4 C)     Temp Source 10/06/23 1646 Oral     SpO2 10/06/23 1646 96 %     Weight 10/06/23 1644 135 lb (61.2 kg)     Height 10/06/23 1644 5' (1.524 m)     Head Circumference --      Peak Flow --      Pain Score 10/06/23 1644 0     Pain Loc --      Pain Education --      Exclude from Growth Chart --     Most recent vital signs: Vitals:   10/06/23 1646  BP: (!) 157/97  Pulse: 93  Resp: 18  Temp: 97.6 F (36.4 C)  SpO2: 96%     General: Awake, no distress.  CV:  Good peripheral perfusion. RRR. Resp:  Normal work of breathing. Lungs with bilateral course breath sounds, L sided rales. Abd:  No distention. No tenderness. No guarding or rebound. Other:  Mildly dry MM.   ED Results / Procedures / Treatments   Labs (all labs ordered are listed, but only abnormal results are displayed) Labs Reviewed  COMPREHENSIVE METABOLIC PANEL - Abnormal; Notable for the following components:      Result Value   Glucose, Bld 117 (*)    All other components  within normal limits  RESP PANEL BY RT-PCR (RSV, FLU A&B, COVID)  RVPGX2  LIPASE, BLOOD  CBC  URINALYSIS, ROUTINE W REFLEX MICROSCOPIC  LACTIC ACID, PLASMA  LACTIC ACID, PLASMA  TROPONIN I (HIGH SENSITIVITY)     EKG    RADIOLOGY CT Head: NAICA CXR: Pending CT A/P: negative   I also independently reviewed and agree with radiologist interpretations.   PROCEDURES:  Critical Care performed: No   MEDICATIONS ORDERED IN ED: Medications  lactated ringers  bolus 1,000 mL (has no administration in time range)  ondansetron  (ZOFRAN ) injection 4 mg (4 mg Intravenous Given 10/06/23 1707)  sodium chloride  0.9 % bolus 1,000 mL (1,000 mLs Intravenous New Bag/Given 10/06/23 1705)  iohexol  (OMNIPAQUE ) 300 MG/ML solution 100 mL (100 mLs Intravenous Contrast Given 10/06/23 2217)     IMPRESSION / MDM / ASSESSMENT AND PLAN / ED COURSE  I reviewed the triage vital signs and the nursing notes.  Differential diagnosis includes, but is not limited to, generalized weakness from viral illness, UTI, enteritis, food-borne illness, atypical ACS/ischemia, anemia, electrolyte abnormality, anemia  Patient's presentation is most consistent with acute presentation with potential threat to life or bodily function.  The patient is on the cardiac monitor to evaluate for evidence of arrhythmia and/or significant heart rate changes  78 yo F with  PMHx GERD, diverticulosis, IPF, here with generalized weakness. Pt too weak to walk and has had nausea, poor appetite. She has no overt abd pain. Labs overall are reassuring. CBC without leukocytosis, or anemia. CMP unremarkable. Lipase is normal. Feeling somewhat better after initial fluids, will continue IVF, f/u UA. CT scan pending. CXR is clear. Pt does have rales on exam but suspect this is from her IPF.   Will give fluids, f/u UA, and ambulate w/ pulse ox.   FINAL CLINICAL IMPRESSION(S) / ED DIAGNOSES   Final diagnoses:  Nausea  and vomiting, unspecified vomiting type  Generalized weakness     Rx / DC Orders   ED Discharge Orders     None        Note:  This document was prepared using Dragon voice recognition software and may include unintentional dictation errors.   Angelena Smalls, MD 10/06/23 [phone]

## 2023-10-07 ENCOUNTER — Observation Stay: Payer: Medicare Other

## 2023-10-07 ENCOUNTER — Encounter: Payer: Self-pay | Admitting: Radiology

## 2023-10-07 DIAGNOSIS — R531 Weakness: Secondary | ICD-10-CM

## 2023-10-07 DIAGNOSIS — R42 Dizziness and giddiness: Secondary | ICD-10-CM | POA: Diagnosis not present

## 2023-10-07 DIAGNOSIS — K529 Noninfective gastroenteritis and colitis, unspecified: Secondary | ICD-10-CM | POA: Diagnosis not present

## 2023-10-07 DIAGNOSIS — F411 Generalized anxiety disorder: Secondary | ICD-10-CM | POA: Diagnosis not present

## 2023-10-07 DIAGNOSIS — J84112 Idiopathic pulmonary fibrosis: Secondary | ICD-10-CM

## 2023-10-07 DIAGNOSIS — R112 Nausea with vomiting, unspecified: Secondary | ICD-10-CM

## 2023-10-07 LAB — CBC WITH DIFFERENTIAL/PLATELET
Abs Immature Granulocytes: 0.01 10*3/uL (ref 0.00–0.07)
Basophils Absolute: 0.1 10*3/uL (ref 0.0–0.1)
Basophils Relative: 1 %
Eosinophils Absolute: 0.1 10*3/uL (ref 0.0–0.5)
Eosinophils Relative: 1 %
HCT: 36.3 % (ref 36.0–46.0)
Hemoglobin: 12.2 g/dL (ref 12.0–15.0)
Immature Granulocytes: 0 %
Lymphocytes Relative: 32 %
Lymphs Abs: 1.8 10*3/uL (ref 0.7–4.0)
MCH: 31 pg (ref 26.0–34.0)
MCHC: 33.6 g/dL (ref 30.0–36.0)
MCV: 92.4 fL (ref 80.0–100.0)
Monocytes Absolute: 0.6 10*3/uL (ref 0.1–1.0)
Monocytes Relative: 11 %
Neutro Abs: 3.1 10*3/uL (ref 1.7–7.7)
Neutrophils Relative %: 55 %
Platelets: 241 10*3/uL (ref 150–400)
RBC: 3.93 MIL/uL (ref 3.87–5.11)
RDW: 13.2 % (ref 11.5–15.5)
WBC: 5.6 10*3/uL (ref 4.0–10.5)
nRBC: 0 % (ref 0.0–0.2)

## 2023-10-07 LAB — RESP PANEL BY RT-PCR (RSV, FLU A&B, COVID)  RVPGX2
Influenza A by PCR: NEGATIVE
Influenza B by PCR: NEGATIVE
Resp Syncytial Virus by PCR: NEGATIVE
SARS Coronavirus 2 by RT PCR: NEGATIVE

## 2023-10-07 LAB — BASIC METABOLIC PANEL
Anion gap: 7 (ref 5–15)
BUN: 8 mg/dL (ref 8–23)
CO2: 27 mmol/L (ref 22–32)
Calcium: 8.4 mg/dL — ABNORMAL LOW (ref 8.9–10.3)
Chloride: 105 mmol/L (ref 98–111)
Creatinine, Ser: 0.7 mg/dL (ref 0.44–1.00)
GFR, Estimated: >60 mL/min (ref >60)
Glucose, Bld: 100 mg/dL — ABNORMAL HIGH (ref 70–99)
Potassium: 3.2 mmol/L — ABNORMAL LOW (ref 3.5–5.1)
Sodium: 139 mmol/L (ref 135–145)

## 2023-10-07 LAB — TROPONIN I (HIGH SENSITIVITY): Troponin I (High Sensitivity): 7 ng/L (ref <18)

## 2023-10-07 LAB — MAGNESIUM: Magnesium: 1.9 mg/dL (ref 1.7–2.4)

## 2023-10-07 MED ORDER — METOCLOPRAMIDE HCL 5 MG/ML IJ SOLN
5.0000 mg | Freq: Once | INTRAMUSCULAR | Status: AC
  Administered 2023-10-07: 5 mg via INTRAVENOUS
  Filled 2023-10-07: qty 2

## 2023-10-07 MED ORDER — HYDRALAZINE HCL 50 MG PO TABS
25.0000 mg | ORAL_TABLET | Freq: Three times a day (TID) | ORAL | Status: DC | PRN
  Administered 2023-10-07: 25 mg via ORAL
  Filled 2023-10-07: qty 1

## 2023-10-07 MED ORDER — ACETAMINOPHEN 325 MG RE SUPP: 650.0000 mg | Freq: Four times a day (QID) | RECTAL | Status: DC | PRN

## 2023-10-07 MED ORDER — PANTOPRAZOLE SODIUM 40 MG PO TBEC
40.0000 mg | DELAYED_RELEASE_TABLET | Freq: Every day | ORAL | Status: DC
  Administered 2023-10-07: 40 mg via ORAL
  Filled 2023-10-07: qty 1

## 2023-10-07 MED ORDER — DULOXETINE HCL 60 MG PO CPEP
60.0000 mg | ORAL_CAPSULE | Freq: Every day | ORAL | Status: DC
  Administered 2023-10-07: 60 mg via ORAL
  Filled 2023-10-07: qty 1

## 2023-10-07 MED ORDER — LOSARTAN POTASSIUM 50 MG PO TABS: 50.0000 mg | ORAL_TABLET | Freq: Every day | ORAL | 1 refills | Status: AC

## 2023-10-07 MED ORDER — SODIUM CHLORIDE 0.9 % IV SOLN: INTRAVENOUS | Status: DC

## 2023-10-07 MED ORDER — MECLIZINE HCL 25 MG PO TABS
25.0000 mg | ORAL_TABLET | Freq: Three times a day (TID) | ORAL | Status: DC | PRN
  Filled 2023-10-07: qty 1

## 2023-10-07 MED ORDER — ONDANSETRON HCL 4 MG PO TABS: 4.0000 mg | ORAL_TABLET | Freq: Every day | ORAL | 1 refills | Status: AC | PRN

## 2023-10-07 MED ORDER — ONDANSETRON HCL 4 MG/2ML IJ SOLN: 4.0000 mg | Freq: Four times a day (QID) | INTRAMUSCULAR | Status: DC | PRN

## 2023-10-07 MED ORDER — KCL-LACTATED RINGERS-D5W 20 MEQ/L IV SOLN
INTRAVENOUS | Status: DC
Start: 2023-10-07 — End: 2023-10-07
  Filled 2023-10-07 (×3): qty 1000

## 2023-10-07 MED ORDER — ENOXAPARIN SODIUM 40 MG/0.4ML IJ SOSY
40.0000 mg | PREFILLED_SYRINGE | INTRAMUSCULAR | Status: DC
Start: 2023-10-07 — End: 2023-10-07
  Administered 2023-10-07: 40 mg via SUBCUTANEOUS
  Filled 2023-10-07: qty 0.4

## 2023-10-07 MED ORDER — LACTATED RINGERS IV SOLN: INTRAVENOUS | Status: DC

## 2023-10-07 MED ORDER — LOSARTAN POTASSIUM 50 MG PO TABS
25.0000 mg | ORAL_TABLET | Freq: Every day | ORAL | Status: DC
Start: 2023-10-07 — End: 2023-10-07
  Administered 2023-10-07: 25 mg via ORAL
  Filled 2023-10-07: qty 1

## 2023-10-07 MED ORDER — ACETAMINOPHEN 325 MG PO TABS: 650.0000 mg | ORAL_TABLET | Freq: Four times a day (QID) | ORAL | Status: DC | PRN

## 2023-10-07 MED ORDER — LOSARTAN POTASSIUM 50 MG PO TABS: 50.0000 mg | ORAL_TABLET | Freq: Every day | ORAL | Status: DC

## 2023-10-07 MED ORDER — MECLIZINE HCL 25 MG PO TABS: 25.0000 mg | ORAL_TABLET | Freq: Three times a day (TID) | ORAL | 0 refills | Status: AC | PRN

## 2023-10-07 NOTE — H&P (Addendum)
 PCP:   Diedra Lame, MD   Chief Complaint:  Dizziness  HPI: This is a 78 year old female with past medical history significant for Idiopathic pulmonary fibrosis, chronic back pain, irritable bowel, anxiety, TIA.  2 days ago she developed intractable nausea and vomiting.  Decreased female with dizziness that has persisted.  Patient states whenever she stands she is immediately presyncopal.  As result she has difficulty ambulating.  She is unable to stand by herself.  Per patient if she picks her head up or she becomes dizzy.  She has no history of vertigo.  She has no history of dizziness prior to yesterday.  She fell yesterday but she did not hit her head.  There is no confusion, no headaches.  No blurred vision.  No other localized weakness.  In the ER patient presenting blood pressure 157/97, 93, afebrile.  Respiratory panel negative.  BMP, CBC, lactic acid normal.  CT head no acute changes.  MRI brain negative.  She has been given 2 L NS bolus.  Admission requested  Review of Systems:  Per HPI.  Past Medical History: Past Medical History:  Diagnosis Date   Allergy    Anxiety    Arthritis    Back muscle spasm    Chronic back pain    Diverticulosis    GERD (gastroesophageal reflux disease)    IBS (irritable bowel syndrome)    Insomnia    Low blood monocyte count    Osteoporosis    Stroke (HCC) 08/2016   Past Surgical History:  Procedure Laterality Date   APPENDECTOMY     CHOLECYSTECTOMY     knee cast     left 2015   uterine tumors      Medications: Prior to Admission medications   Medication Sig Start Date End Date Taking? Authorizing Provider  albuterol  (VENTOLIN  HFA) 108 (90 Base) MCG/ACT inhaler Inhale 1 puff into the lungs every 4 (four) hours as needed for wheezing or shortness of breath. 04/18/20   Laurita Pillion, MD  atorvastatin  (LIPITOR) 20 MG tablet Take 20 mg by mouth daily.    [provider]  Cholecalciferol  (VITAMIN D -1000 MAX ST) 1000 units tablet  Take 1,000 mg by mouth daily.    [provider]  diphenhydrAMINE  (BENADRYL ) 25 mg capsule Take 25 mg by mouth at bedtime as needed for allergies.     [provider]  DULoxetine  (CYMBALTA ) 60 MG capsule Take 1 capsule (60 mg total) by mouth daily. 04/18/20   Laurita Pillion, MD  pantoprazole  (PROTONIX ) 40 MG tablet Take 1 tablet (40 mg total) by mouth daily. Patient taking differently: Take 40 mg by mouth 2 (two) times daily.  12/20/14   Garrick Charleston, MD  traMADol  (ULTRAM ) 50 MG tablet Take 1 tablet (50 mg total) by mouth 2 (two) times daily as needed. 05/07/18   Myra Lynwood MATSU, MD  traZODone  (DESYREL ) 100 MG tablet Take 100 mg by mouth at bedtime as needed for sleep.  12/01/14   [provider]    Allergies:   Allergies  Allergen Reactions   Sulfa Antibiotics Anaphylaxis   Tape Other (See Comments)   Latex Itching and Rash    Social History:  reports that she has quit smoking. She has never used smokeless tobacco. She reports that she does not drink alcohol and does not use drugs.  Family History: Family History  Problem Relation Age of Onset   GI problems Mother    Cancer Father     Physical Exam: Vitals:  10/07/23 0020 10/07/23 0050 10/07/23 0100 10/07/23 0245  BP:  (!) 153/92 (!) 166/79   Pulse: 74 77 68 64  Resp: 19 (!) 25 (!) 25 17  Temp:      TempSrc:      SpO2: 99% 96% 94% 94%  Weight:      Height:        General: A and O x 3, well developed and nourished, no acute distress Eyes: Pink conjunctiva, no scleral icterus ENT: Moist oral mucosa, neck supple, no thyromegaly Lungs: CTA B/L, no wheeze, no crackles, no use of accessory muscles Cardiovascular: RRR, no gallops, no murmurs. No carotid bruits, no JVD Abdomen: soft, positive BS, NTND, no organomegaly, not an acute abdomen GU: not examined Neuro: CN II - XII grossly intact, sensation intact Musculoskeletal: strength 5/5 all extremities.  No nystagmus.  Negative Dix-Hallpike Skin: no  rash, no subcutaneous crepitation, no decubitus Psych: appropriate patient   Labs on Admission:  Recent Labs    10/06/23 1651  NA 139  K 3.6  CL 98  CO2 27  GLUCOSE 117*  BUN 10  CREATININE 0.90  CALCIUM  9.2   Recent Labs    10/06/23 1651  AST 18  ALT 11  ALKPHOS 44  BILITOT 0.9  PROT 7.0  ALBUMIN 4.1   Recent Labs    10/06/23 1651  LIPASE 31   Recent Labs    10/06/23 1651  WBC 5.5  HGB 14.0  HCT 42.9  MCV 93.5  PLT 287    Micro Results: Recent Results (from the past 240 hours)  Resp panel by RT-PCR (RSV, Flu A&B, Covid) Anterior Nasal Swab     Status: None   Collection Time: 10/06/23 11:19 PM   Specimen: Anterior Nasal Swab  Result Value Ref Range Status   SARS Coronavirus 2 by RT PCR NEGATIVE NEGATIVE Final    Comment: (NOTE) SARS-CoV-2 target nucleic acids are NOT DETECTED.  The SARS-CoV-2 RNA is generally detectable in upper respiratory specimens during the acute phase of infection. The lowest concentration of SARS-CoV-2 viral copies this assay can detect is 138 copies/mL. A negative result does not preclude SARS-Cov-2 infection and should not be used as the sole basis for treatment or other patient management decisions. A negative result may occur with  improper specimen collection/handling, submission of specimen other than nasopharyngeal swab, presence of viral mutation(s) within the areas targeted by this assay, and inadequate number of viral copies(<138 copies/mL). A negative result must be combined with clinical observations, patient history, and epidemiological information. The expected result is Negative.  Fact Sheet for Patients:  bloggercourse.com  Fact Sheet for Healthcare Providers:  seriousbroker.it  This test is no t yet approved or cleared by the [place]  FDA and  has been authorized for detection and/or diagnosis of SARS-CoV-2 by FDA under an Emergency Use Authorization  (EUA). This EUA will remain  in effect (meaning this test can be used) for the duration of the COVID-19 declaration under Section 564(b)(1) of the Act, 21 U.S.C.section 360bbb-3(b)(1), unless the authorization is terminated  or revoked sooner.       Influenza A by PCR NEGATIVE NEGATIVE Final   Influenza B by PCR NEGATIVE NEGATIVE Final    Comment: (NOTE) The Xpert Xpress SARS-CoV-2/FLU/RSV plus assay is intended as an aid in the diagnosis of influenza from Nasopharyngeal swab specimens and should not be used as a sole basis for treatment. Nasal washings and aspirates are unacceptable for Xpert Xpress SARS-CoV-2/FLU/RSV testing.  Fact Sheet  for Patients: bloggercourse.com  Fact Sheet for Healthcare Providers: seriousbroker.it  This test is not yet approved or cleared by the [place]  FDA and has been authorized for detection and/or diagnosis of SARS-CoV-2 by FDA under an Emergency Use Authorization (EUA). This EUA will remain in effect (meaning this test can be used) for the duration of the COVID-19 declaration under Section 564(b)(1) of the Act, 21 U.S.C. section 360bbb-3(b)(1), unless the authorization is terminated or revoked.     Resp Syncytial Virus by PCR NEGATIVE NEGATIVE Final    Comment: (NOTE) Fact Sheet for Patients: bloggercourse.com  Fact Sheet for Healthcare Providers: seriousbroker.it  This test is not yet approved or cleared by the [place]  FDA and has been authorized for detection and/or diagnosis of SARS-CoV-2 by FDA under an Emergency Use Authorization (EUA). This EUA will remain in effect (meaning this test can be used) for the duration of the COVID-19 declaration under Section 564(b)(1) of the Act, 21 U.S.C. section 360bbb-3(b)(1), unless the authorization is terminated or revoked.  Performed at [facility], [address],  [place], [place] [address]      Radiological Exams on Admission: DG Chest Portable 1 View Result Date: 10/06/2023 CLINICAL DATA:  Weakness EXAM: PORTABLE CHEST 1 VIEW COMPARISON:  04/15/2020 FINDINGS: Cardiac shadow is stable. Lungs are well aerated bilaterally. No focal infiltrate or sizable effusion is seen. No bony abnormality is noted. IMPRESSION: No active disease. Electronically Signed   By: Oneil Devonshire M.D.   On: 10/06/2023 23:11   CT HEAD WO CONTRAST ( ) Result Date: 10/06/2023 CLINICAL DATA:  Recent syncopal episodes EXAM: CT HEAD WITHOUT CONTRAST TECHNIQUE: Contiguous axial images were obtained from the base of the skull through the vertex without intravenous contrast. RADIATION DOSE REDUCTION: This exam was performed according to the departmental dose-optimization program which includes automated exposure control, adjustment of the mA and/or kV according to patient size and/or use of iterative reconstruction technique. COMPARISON:  04/16/2020 FINDINGS: Brain: No evidence of acute infarction, hemorrhage, hydrocephalus, extra-axial collection or mass lesion/mass effect. Mild atrophic changes are noted. Mild chronic white matter ischemic changes noted as well. Vascular: No hyperdense vessel or unexpected calcification. Skull: Normal. Negative for fracture or focal lesion. Sinuses/Orbits: No acute finding. Other: None. IMPRESSION: Chronic atrophic and ischemic changes.  No acute abnormality noted. Electronically Signed   By: Oneil Devonshire M.D.   On: 10/06/2023 23:10   CT ABDOMEN PELVIS W CONTRAST Result Date: 10/06/2023 CLINICAL DATA:  Acute abdominal pain with nausea EXAM: CT ABDOMEN AND PELVIS WITH CONTRAST TECHNIQUE: Multidetector CT imaging of the abdomen and pelvis was performed using the standard protocol following bolus administration of intravenous contrast. RADIATION DOSE REDUCTION: This exam was performed according to the departmental dose-optimization program which includes automated  exposure control, adjustment of the mA and/or kV according to patient size and/or use of iterative reconstruction technique. CONTRAST:  OMNIPAQUE  IOHEXOL  300 MG/ML  SOLN COMPARISON:  12/15/2014 FINDINGS: Lower chest: Chronic interstitial changes are noted in the bases. Hepatobiliary: No focal liver abnormality is seen. Status post cholecystectomy. No biliary dilatation. Pancreas: Unremarkable. No pancreatic ductal dilatation or surrounding inflammatory changes. Spleen: Normal in size without focal abnormality. Adrenals/Urinary Tract: Adrenal glands are within normal limits. Kidneys demonstrate a normal enhancement pattern bilaterally. No renal calculi are noted. Prominent extrarenal pelves are seen bilaterally. No obstructive changes are noted. The bladder is within normal limits. Stomach/Bowel: No obstructive or inflammatory changes are noted. Postsurgical changes consistent with prior appendectomy are seen. Small bowel and  stomach are within normal limits. Vascular/Lymphatic: No significant vascular findings are present. No enlarged abdominal or pelvic lymph nodes. Reproductive: Uterus and bilateral adnexa are unremarkable. Other: No abdominal wall hernia or abnormality. No abdominopelvic ascites. Musculoskeletal: No acute or significant osseous findings. IMPRESSION: No acute abnormality noted to correspond with the given clinical history. Electronically Signed   By: Oneil Devonshire M.D.   On: 10/06/2023 23:08    Assessment/Plan Present on Admission:  Dizziness -Orthostatic vitals ordered.  Would likely be negative as patient received IV fluids.  Patient remains dizzy. -CT head, MRI head negative, no strokes -PT consult for vestibular PT -Meclizine  as needed -Continue IV fluid hydration   Gastroenteritis -Improving.  Continue IV fluid hydration. -Antiemetics as needed   Anxiety state -Continue Cymbalta . -Trazodone  held  Idiopathic pulmonary fibrosis -Nebs as needed  GERD -Protonix   resumed  Maddyson Keil 10/07/2023, 3:14 AM

## 2023-10-07 NOTE — Progress Notes (Signed)
 Physical Therapy Evaluation Patient Details Name: Ashley Murillo MRN: [id number] DOB: 1946-02-26 Today's Date: 10/07/2023  History of Present Illness  Ashley Murillo is a 78 y.o. female here with generalized weakness.  The patient states that over the last 2 days, she has had nausea, vomiting, and very poor appetite.  She has had progressive worsening weakness and dizziness.   Clinical Impression  Orders Received. Chart Reviewed. Patient supine in bed upon PT arrival, agreeable to PT evaluation this date/time. Family member at bedside. Patient reports prior to admission, was IND with mobility and ADLs, no AD use. Does report two falls in past 6 months. Patient lives alone, in single level home with family members able to provide assist if needed.   On evaluation, patient reports resolution of dizziness. Endorses dizziness as lightheadedness sensation. Oculomotor screen normal. Patient able to complete bed mobility with Mod I, supervision with functional transfers and ambulation ~100 ft. Pt able to ambulate without AD but mild unsteadiness, with single UE support improved stability. Educated on use of [facility] for stability upon discharge. Pt and family member verbalize understanding. Patient denies dizziness/lightheadedness with mobility attempts. Vitals stable. Patient returned to bed and left with all needs in reach, nursing at bedside. Patient close to functional baseline and no skilled acute PT needs at this time. Do not anticipate need for PT services upon discharge. PT will sign off.       If plan is discharge home, recommend the following: Assist for transportation   Can travel by private vehicle        Equipment Recommendations None recommended by PT  Recommendations for Other Services       Functional Status Assessment Patient has not had a recent decline in their functional status     Precautions / Restrictions Precautions Precautions: Fall Restrictions Weight Bearing  Restrictions Per Provider Order: No      Mobility  Bed Mobility Overal bed mobility: Independent             General bed mobility comments: Pt able to get supine <> sit without assist. No dizziness with bed mobility.    Transfers Overall transfer level: Needs assistance Equipment used: None Transfers: Sit to/from Stand Sit to Stand: Supervision           General transfer comment: supervision for safety; no physical assist. Denies dizziness/lightheadedness.    Ambulation/Gait Ambulation/Gait assistance: Supervision Gait Distance (Feet): 100 Feet Assistive device: IV Pole, None Gait Pattern/deviations: WFL(Within Functional Limits)       General Gait Details: Pt ambulating initially with use of UE support via IV pole with good stability noted. Progressed to no UE support, patient supervision throughout with mild unsteadiness without UE support, with some veering to R but able to maintain balance without physical assist. Encouraged patient to use [facility] for stability upon discharge. Supervision only provided for safety. Vitals stable.  Stairs Stairs:  (not assessed; with patient's current level of function do not anticipate any barriers to entering/exit home.)          Wheelchair Mobility     Tilt Bed    Modified Rankin (Stroke Patients Only)       Balance Overall balance assessment: Needs assistance Sitting-balance support: No upper extremity supported, Feet supported Sitting balance-Leahy Scale: Normal     Standing balance support: During functional activity, No upper extremity supported Standing balance-Leahy Scale: Good  Pertinent Vitals/Pain Pain Assessment Pain Assessment: No/denies pain    Home Living Family/patient expects to be discharged to:: Private residence Living Arrangements: Alone Available Help at Discharge: Family;Available PRN/intermittently Type of Home: House Home Access: Level entry        Home Layout: One level Home Equipment: Cane - single point;Rollator (4 wheels)      Prior Function Prior Level of Function : Independent/Modified Independent;Driving             Mobility Comments: Patient reports prior to admission was IND with mobility, no AD use. Would walk her dog. Reports 2 falls in past 6 months, but reports they were accidental. ADLs Comments: IND with ADLs/IADLs     Extremity/Trunk Assessment   Upper Extremity Assessment Upper Extremity Assessment: Overall WFL for tasks assessed    Lower Extremity Assessment Lower Extremity Assessment: Generalized weakness       Communication   Communication Communication: No apparent difficulties  Cognition Arousal: Alert Behavior During Therapy: WFL for tasks assessed/performed Overall Cognitive Status: Within Functional Limits for tasks assessed                                          General Comments      Exercises Other Exercises Other Exercises: PT educating on energy conservation, use of SPC for stability. Other Exercises: Oculomotor Screen Normal. Dizziness described as lightheadedness in setting of severe nausea/vomitting. No peripheral vestibular impairments noted on evaluation.   Assessment/Plan    PT Assessment Patient does not need any further PT services  PT Problem List         PT Treatment Interventions      PT Goals (Current goals can be found in the Care Plan section)       Frequency       Co-evaluation               AM-PAC PT 6 Clicks Mobility  Outcome Measure Help needed turning from your back to your side while in a flat bed without using bedrails?: None Help needed moving from lying on your back to sitting on the side of a flat bed without using bedrails?: None Help needed moving to and from a bed to a chair (including a wheelchair)?: None Help needed standing up from a chair using your arms (e.g., wheelchair or bedside chair)?: None Help  needed to walk in hospital room?: A Little Help needed climbing 3-5 steps with a railing? : A Little 6 Click Score: 22    End of Session Equipment Utilized During Treatment: Gait belt Activity Tolerance: Patient tolerated treatment well Patient left: in bed;with call bell/phone within reach;with nursing/sitter in room;with family/visitor present Nurse Communication: Mobility status PT Visit Diagnosis: Unsteadiness on feet (R26.81);Dizziness and giddiness (R42)    Time: 8696-8683 PT Time Calculation (min) (ACUTE ONLY): 13 min   Charges:   PT Evaluation $PT Eval Low Complexity: 1 Low   PT General Charges $$ ACUTE PT VISIT: 1 Visit         Carolyn HERO Fairly, PT, DPT 10/07/23 1:35 PM

## 2023-10-07 NOTE — ED Notes (Signed)
 Pt declines continued use of pure wick

## 2023-10-07 NOTE — ED Notes (Signed)
 Report to receiving RN regarding pt displeasure with hallway placement noted care transferred

## 2023-10-07 NOTE — ED Notes (Signed)
 Pt hx cystic fibrosis noted to have decreased O2 while resting quietly 2L O2 applied via [place] to assist in increase of O2

## 2023-10-07 NOTE — ED Notes (Signed)
 PO challenge successful  Pt unable to ambulate without vertigo

## 2023-10-07 NOTE — Care Management Obs Status (Signed)
 MEDICARE OBSERVATION STATUS NOTIFICATION   Patient Details  Name: Ashley Murillo MRN: [id number] Date of Birth: 10/19/1945   Medicare Observation Status Notification Given:  Yes  CSW spoke with patient, she confirmed she understands information and provided verbal consent to signs.  [name], LCSWA 10/07/2023, 2:12 PM

## 2023-10-07 NOTE — Hospital Course (Addendum)
 Taken from H&P.  78 year old female with past medical history significant for Idiopathic pulmonary fibrosis, chronic back pain, irritable bowel, anxiety, TIA.  2 days ago she developed intractable nausea and vomiting, that resulted in persistent dizziness and presyncope whenever she is trying to stand by herself.  She becomes dizzy whenever she picks up her head.  Had a fall yesterday.  On presentation patient with mildly elevated blood pressure at 157/97, respiratory panel negative.  Rest of the labs unremarkable.  CT head with no acute abnormality.  MRI brain was negative for any acute infarct.  Patient was given 2 L of normal saline bolus. PT for vestibular rehab was ordered.  1/4: Blood pressure elevated at 170/81, mild hypokalemia with potassium at 3.2, magnesium  normal.  UA with trace leukocytes and 5 ketones. Patient was not on any antihypertensives at home, started on losartan  due to persistently elevated blood pressure-dose need to be titrated.  Her symptoms which include nausea, vomiting and dizziness improved.  He was able to tolerate advancement in diet.  Mild hypokalemia which was repleted.  Patient is being discharged on losartan  with instruction to have a close follow-up with PCP for further assistance.  She is also given meclizine  and Zofran  to use as needed.  Patient will continue the rest of her home medications and follow-up with her providers closely for further management.

## 2023-10-07 NOTE — ED Notes (Signed)
 This RN to bedside to speak with patient and family adult nurse. Caregiver and patient state frustration with her being the hallway. This RN apologized and attempted to explain the situation. Offered purewick to for more privacy due to patient main complaint being no privacy while urinating on a bedpan. This RN also offered something for patient to cover her eyes due to caregiver c/o bright lights.

## 2023-10-07 NOTE — ED Notes (Signed)
 Pharmacy states IVF sent to ED writer RN unable to locate reported IVF in ED pharmacy notified ivf not located in ED

## 2023-10-07 NOTE — ED Notes (Signed)
 Pt up to the bathroom at this time. Pt gait steady. Denies dizziness.

## 2023-10-07 NOTE — Discharge Summary (Signed)
 Physician Discharge Summary   Patient: Ashley Murillo MRN: [id number] DOB: Jul 21, 1946  Admit date:     10/06/2023  Discharge date: 10/07/23  Discharge Physician: Amaryllis Dare   PCP: Diedra Lame, MD   Recommendations at discharge:  Please obtain CBC and BMP on follow-up Patient is being started on losartan  due to persistently elevated blood pressure, please monitor and titrate Follow-up with primary care provider within a week  Discharge Diagnoses: Principal Problem:   Gastroenteritis Active Problems:   Anxiety state   Idiopathic pulmonary fibrosis (HCC)   Nausea and vomiting   Generalized weakness   Hospital Course: Taken from H&P.  78 year old female with past medical history significant for Idiopathic pulmonary fibrosis, chronic back pain, irritable bowel, anxiety, TIA.  2 days ago she developed intractable nausea and vomiting, that resulted in persistent dizziness and presyncope whenever she is trying to stand by herself.  She becomes dizzy whenever she picks up her head.  Had a fall yesterday.  On presentation patient with mildly elevated blood pressure at 157/97, respiratory panel negative.  Rest of the labs unremarkable.  CT head with no acute abnormality.  MRI brain was negative for any acute infarct.  Patient was given 2 L of normal saline bolus. PT for vestibular rehab was ordered.  1/4: Blood pressure elevated at 170/81, mild hypokalemia with potassium at 3.2, magnesium  normal.  UA with trace leukocytes and 5 ketones. Patient was not on any antihypertensives at home, started on losartan  due to persistently elevated blood pressure-dose need to be titrated.  Her symptoms which include nausea, vomiting and dizziness improved.  He was able to tolerate advancement in diet.  Mild hypokalemia which was repleted.  Patient is being discharged on losartan  with instruction to have a close follow-up with PCP for further assistance.  She is also given meclizine  and Zofran  to  use as needed.  Patient will continue the rest of her home medications and follow-up with her providers closely for further management.   Consultants: None Procedures performed: None Disposition: Home Diet recommendation:  Discharge Diet Orders (From admission, onward)     Start     Ordered   10/07/23 0000  Diet - low sodium heart healthy        10/07/23 1401           Cardiac diet DISCHARGE MEDICATION: Allergies as of 10/07/2023       Reactions   Sulfa Antibiotics Anaphylaxis   Tape Other (See Comments)   Latex Itching, Rash        Medication List     TAKE these medications    albuterol  108 (90 Base) MCG/ACT inhaler Commonly known as: VENTOLIN  HFA Inhale 1 puff into the lungs every 4 (four) hours as needed for wheezing or shortness of breath.   atorvastatin  20 MG tablet Commonly known as: LIPITOR Take 20 mg by mouth daily.   diphenhydrAMINE  25 mg capsule Commonly known as: BENADRYL  Take 25 mg by mouth at bedtime as needed for allergies.   DULoxetine  60 MG capsule Commonly known as: CYMBALTA  Take 1 capsule (60 mg total) by mouth daily.   losartan  50 MG tablet Commonly known as: COZAAR  Take 1 tablet (50 mg total) by mouth daily. Start taking on: October 08, 2023   meclizine  25 MG tablet Commonly known as: ANTIVERT  Take 1 tablet (25 mg total) by mouth 3 (three) times daily as needed for dizziness.   ondansetron  4 MG tablet Commonly known as: Zofran  Take 1 tablet (4 mg total) by  mouth daily as needed for nausea or vomiting.   pantoprazole  40 MG tablet Commonly known as: PROTONIX  Take 1 tablet (40 mg total) by mouth daily. What changed: when to take this   traMADol  50 MG tablet Commonly known as: ULTRAM  Take 1 tablet (50 mg total) by mouth 2 (two) times daily as needed.   traZODone  100 MG tablet Commonly known as: DESYREL  Take 100 mg by mouth at bedtime as needed for sleep.   Vitamin D -1000 Max St 25 MCG (1000 UT) tablet Generic drug:  Cholecalciferol  Take 1,000 mg by mouth daily.        Follow-up Information     Diedra Lame, MD. Schedule an appointment as soon as possible for a visit in 1 week(s).   Specialty: Family Medicine Contact information: [address] [facility] [place] [address] [phone]                Discharge Exam: Fredricka Weights   10/06/23 1644  Weight: 61.2 kg   General.  Well-developed elderly lady, in no acute distress. Pulmonary.  Lungs clear bilaterally, normal respiratory effort. CV.  Regular rate and rhythm, no JVD, rub or murmur. Abdomen.  Soft, nontender, nondistended, BS positive. CNS.  Alert and oriented .  No focal neurologic deficit. Extremities.  No edema, no cyanosis, pulses intact and symmetrical. Psychiatry.  Judgment and insight appears normal.   Condition at discharge: stable  The results of significant diagnostics from this hospitalization (including imaging, microbiology, ancillary and laboratory) are listed below for reference.   Imaging Studies: MR BRAIN WO CONTRAST Result Date: 10/07/2023 CLINICAL DATA:  Dizziness.  Severe vertigo for 2 days EXAM: MRI HEAD WITHOUT CONTRAST TECHNIQUE: Multiplanar, multiecho pulse sequences of the brain and surrounding structures were obtained without intravenous contrast. COMPARISON:  Head CT from yesterday FINDINGS: Brain: No acute infarction, hemorrhage, hydrocephalus, extra-axial collection or mass lesion. Disproportionate subarachnoid spaces with sulcal narrowing at the vertex, but no ventriculomegaly to the degree that communicating hydrocephalus is implied. Rare remote white matter insults, less than commonly seen for age. Vascular: Normal flow voids, including vertebrobasilar. Skull and upper cervical spine: Normal marrow signal. Sinuses/Orbits: No mastoid or middle ear opacification. Bilateral cataract resection. IMPRESSION: No acute finding or explanation for symptoms.  No  infarct. Electronically Signed   By: Dorn Roulette M.D.   On: 10/07/2023 05:20   DG Chest Portable 1 View Result Date: 10/06/2023 CLINICAL DATA:  Weakness EXAM: PORTABLE CHEST 1 VIEW COMPARISON:  04/15/2020 FINDINGS: Cardiac shadow is stable. Lungs are well aerated bilaterally. No focal infiltrate or sizable effusion is seen. No bony abnormality is noted. IMPRESSION: No active disease. Electronically Signed   By: Oneil Devonshire M.D.   On: 10/06/2023 23:11   CT HEAD WO CONTRAST ( ) Result Date: 10/06/2023 CLINICAL DATA:  Recent syncopal episodes EXAM: CT HEAD WITHOUT CONTRAST TECHNIQUE: Contiguous axial images were obtained from the base of the skull through the vertex without intravenous contrast. RADIATION DOSE REDUCTION: This exam was performed according to the departmental dose-optimization program which includes automated exposure control, adjustment of the mA and/or kV according to patient size and/or use of iterative reconstruction technique. COMPARISON:  04/16/2020 FINDINGS: Brain: No evidence of acute infarction, hemorrhage, hydrocephalus, extra-axial collection or mass lesion/mass effect. Mild atrophic changes are noted. Mild chronic white matter ischemic changes noted as well. Vascular: No hyperdense vessel or unexpected calcification. Skull: Normal. Negative for fracture or focal lesion. Sinuses/Orbits: No acute finding. Other: None. IMPRESSION:  Chronic atrophic and ischemic changes.  No acute abnormality noted. Electronically Signed   By: Oneil Devonshire M.D.   On: 10/06/2023 23:10   CT ABDOMEN PELVIS W CONTRAST Result Date: 10/06/2023 CLINICAL DATA:  Acute abdominal pain with nausea EXAM: CT ABDOMEN AND PELVIS WITH CONTRAST TECHNIQUE: Multidetector CT imaging of the abdomen and pelvis was performed using the standard protocol following bolus administration of intravenous contrast. RADIATION DOSE REDUCTION: This exam was performed according to the departmental dose-optimization program which  includes automated exposure control, adjustment of the mA and/or kV according to patient size and/or use of iterative reconstruction technique. CONTRAST:  OMNIPAQUE  IOHEXOL  300 MG/ML  SOLN COMPARISON:  12/15/2014 FINDINGS: Lower chest: Chronic interstitial changes are noted in the bases. Hepatobiliary: No focal liver abnormality is seen. Status post cholecystectomy. No biliary dilatation. Pancreas: Unremarkable. No pancreatic ductal dilatation or surrounding inflammatory changes. Spleen: Normal in size without focal abnormality. Adrenals/Urinary Tract: Adrenal glands are within normal limits. Kidneys demonstrate a normal enhancement pattern bilaterally. No renal calculi are noted. Prominent extrarenal pelves are seen bilaterally. No obstructive changes are noted. The bladder is within normal limits. Stomach/Bowel: No obstructive or inflammatory changes are noted. Postsurgical changes consistent with prior appendectomy are seen. Small bowel and stomach are within normal limits. Vascular/Lymphatic: No significant vascular findings are present. No enlarged abdominal or pelvic lymph nodes. Reproductive: Uterus and bilateral adnexa are unremarkable. Other: No abdominal wall hernia or abnormality. No abdominopelvic ascites. Musculoskeletal: No acute or significant osseous findings. IMPRESSION: No acute abnormality noted to correspond with the given clinical history. Electronically Signed   By: Oneil Devonshire M.D.   On: 10/06/2023 23:08    Microbiology: Results for orders placed or performed during the hospital encounter of 10/06/23  Resp panel by RT-PCR (RSV, Flu A&B, Covid) Anterior Nasal Swab     Status: None   Collection Time: 10/06/23 11:19 PM   Specimen: Anterior Nasal Swab  Result Value Ref Range Status   SARS Coronavirus 2 by RT PCR NEGATIVE NEGATIVE Final    Comment: (NOTE) SARS-CoV-2 target nucleic acids are NOT DETECTED.  The SARS-CoV-2 RNA is generally detectable in upper respiratory specimens  during the acute phase of infection. The lowest concentration of SARS-CoV-2 viral copies this assay can detect is 138 copies/mL. A negative result does not preclude SARS-Cov-2 infection and should not be used as the sole basis for treatment or other patient management decisions. A negative result may occur with  improper specimen collection/handling, submission of specimen other than nasopharyngeal swab, presence of viral mutation(s) within the areas targeted by this assay, and inadequate number of viral copies(<138 copies/mL). A negative result must be combined with clinical observations, patient history, and epidemiological information. The expected result is Negative.  Fact Sheet for Patients:  bloggercourse.com  Fact Sheet for Healthcare Providers:  seriousbroker.it  This test is no t yet approved or cleared by the [place]  FDA and  has been authorized for detection and/or diagnosis of SARS-CoV-2 by FDA under an Emergency Use Authorization (EUA). This EUA will remain  in effect (meaning this test can be used) for the duration of the COVID-19 declaration under Section 564(b)(1) of the Act, 21 U.S.C.section 360bbb-3(b)(1), unless the authorization is terminated  or revoked sooner.       Influenza A by PCR NEGATIVE NEGATIVE Final   Influenza B by PCR NEGATIVE NEGATIVE Final    Comment: (NOTE) The Xpert Xpress SARS-CoV-2/FLU/RSV plus assay is intended as an aid in the diagnosis of influenza from  Nasopharyngeal swab specimens and should not be used as a sole basis for treatment. Nasal washings and aspirates are unacceptable for Xpert Xpress SARS-CoV-2/FLU/RSV testing.  Fact Sheet for Patients: bloggercourse.com  Fact Sheet for Healthcare Providers: seriousbroker.it  This test is not yet approved or cleared by the [place]  FDA and has been authorized for detection  and/or diagnosis of SARS-CoV-2 by FDA under an Emergency Use Authorization (EUA). This EUA will remain in effect (meaning this test can be used) for the duration of the COVID-19 declaration under Section 564(b)(1) of the Act, 21 U.S.C. section 360bbb-3(b)(1), unless the authorization is terminated or revoked.     Resp Syncytial Virus by PCR NEGATIVE NEGATIVE Final    Comment: (NOTE) Fact Sheet for Patients: bloggercourse.com  Fact Sheet for Healthcare Providers: seriousbroker.it  This test is not yet approved or cleared by the [place]  FDA and has been authorized for detection and/or diagnosis of SARS-CoV-2 by FDA under an Emergency Use Authorization (EUA). This EUA will remain in effect (meaning this test can be used) for the duration of the COVID-19 declaration under Section 564(b)(1) of the Act, 21 U.S.C. section 360bbb-3(b)(1), unless the authorization is terminated or revoked.  Performed at [facility], [address] [place], [place] [address]     Labs: CBC: Recent Labs  Lab 10/06/23 1651 10/07/23 0548  WBC 5.5 5.6  NEUTROABS  --  3.1  HGB 14.0 12.2  HCT 42.9 36.3  MCV 93.5 92.4  PLT 287 241   Basic Metabolic Panel: Recent Labs  Lab 10/06/23 1651 10/07/23 0548  NA 139 139  K 3.6 3.2*  CL 98 105  CO2 27 27  GLUCOSE 117* 100*  BUN 10 8  CREATININE 0.90 0.70  CALCIUM  9.2 8.4*  MG  --  1.9   Liver Function Tests: Recent Labs  Lab 10/06/23 1651  AST 18  ALT 11  ALKPHOS 44  BILITOT 0.9  PROT 7.0  ALBUMIN 4.1   CBG: No results for input(s): GLUCAP in the last 168 hours.  Discharge time spent: greater than 30 minutes.  This record has been created using Conservation officer, historic buildings. Errors have been sought and corrected,but may not always be located. Such creation errors do not reflect on the standard of care.   Signed: Amaryllis Dare, MD [facility] 10/07/2023

## 2023-10-07 NOTE — ED Notes (Signed)
 Pt reports Nausea pt request to use restroom reports severe vertigo and generalized weakness pt states "I'm going to pass out" pt remains in wc and placed back into stretcher provided bedpan charge RN notified of pt status

## 2023-10-07 NOTE — ED Notes (Signed)
 Pt reports feeling significantly weak and dizzy pt provided bedpan pt has approx clear light yellow urine output pt remains in 18 H reports significant discomfort with hallway placement writer RN attempts to provide privacy and Charge RN  has been notified unable to change to room @ this time

## 2023-10-07 NOTE — Care Management Important Message (Signed)
 Important Message  Patient Details  Name: Ashley Murillo MRN: [id number] Date of Birth: 27-Jan-1946   Important Message Given:  Yes - Medicare IM     Colette Ribas, LCSWA 10/07/2023, 2:12 PM

## 2023-11-22 ENCOUNTER — Other Ambulatory Visit: Payer: Self-pay | Admitting: Pulmonary Disease

## 2023-11-22 DIAGNOSIS — J849 Interstitial pulmonary disease, unspecified: Secondary | ICD-10-CM

## 2023-11-22 DIAGNOSIS — R0609 Other forms of dyspnea: Secondary | ICD-10-CM

## 2023-12-18 ENCOUNTER — Ambulatory Visit
Admission: RE | Admit: 2023-12-18 | Discharge: 2023-12-18 | Disposition: A | Discharge status: Home / Self Care | Payer: Medicare Other | Source: Ambulatory Visit | Attending: Pulmonary Disease | Admitting: Pulmonary Disease

## 2023-12-18 DIAGNOSIS — J849 Interstitial pulmonary disease, unspecified: Secondary | ICD-10-CM | POA: Insufficient documentation

## 2023-12-18 DIAGNOSIS — R0609 Other forms of dyspnea: Secondary | ICD-10-CM | POA: Insufficient documentation
